# Patient Record
Sex: Male | Born: 1950 | Hispanic: No | Marital: Single
Health system: Southern US, Community
[De-identification: ages and names within clinical notes are randomized; demographics above are authoritative.]

## PROBLEM LIST (undated history)

## (undated) DIAGNOSIS — E559 Vitamin D deficiency, unspecified: Secondary | ICD-10-CM

## (undated) DIAGNOSIS — K219 Gastro-esophageal reflux disease without esophagitis: Secondary | ICD-10-CM

## (undated) DIAGNOSIS — E079 Disorder of thyroid, unspecified: Secondary | ICD-10-CM

## (undated) DIAGNOSIS — I4719 Other supraventricular tachycardia: Secondary | ICD-10-CM

## (undated) DIAGNOSIS — J189 Pneumonia, unspecified organism: Secondary | ICD-10-CM

## (undated) DIAGNOSIS — I69351 Hemiplegia and hemiparesis following cerebral infarction affecting right dominant side: Secondary | ICD-10-CM

## (undated) DIAGNOSIS — Z9181 History of falling: Secondary | ICD-10-CM

## (undated) DIAGNOSIS — I10 Essential (primary) hypertension: Secondary | ICD-10-CM

## (undated) DIAGNOSIS — D509 Iron deficiency anemia, unspecified: Secondary | ICD-10-CM

## (undated) DIAGNOSIS — D649 Anemia, unspecified: Secondary | ICD-10-CM

## (undated) DIAGNOSIS — I69959 Hemiplegia and hemiparesis following unspecified cerebrovascular disease affecting unspecified side: Secondary | ICD-10-CM

## (undated) DIAGNOSIS — R262 Difficulty in walking, not elsewhere classified: Secondary | ICD-10-CM

## (undated) DIAGNOSIS — R4701 Aphasia: Secondary | ICD-10-CM

## (undated) DIAGNOSIS — I619 Nontraumatic intracerebral hemorrhage, unspecified: Secondary | ICD-10-CM

## (undated) DIAGNOSIS — R1319 Other dysphagia: Secondary | ICD-10-CM

## (undated) DIAGNOSIS — I471 Supraventricular tachycardia: Secondary | ICD-10-CM

## (undated) DIAGNOSIS — I639 Cerebral infarction, unspecified: Secondary | ICD-10-CM

## (undated) DIAGNOSIS — E785 Hyperlipidemia, unspecified: Secondary | ICD-10-CM

## (undated) HISTORY — DX: Cerebral infarction, unspecified: I63.9

## (undated) HISTORY — DX: Anemia, unspecified: D64.9

## (undated) HISTORY — DX: Essential (primary) hypertension: I10

## (undated) HISTORY — PX: PEG PLACEMENT: SHX5437

## (undated) HISTORY — DX: Nontraumatic intracerebral hemorrhage, unspecified: I61.9

## (undated) HISTORY — DX: Hemiplegia and hemiparesis following cerebral infarction affecting right dominant side: I69.351

## (undated) HISTORY — DX: Pneumonia, unspecified organism: J18.9

## (undated) HISTORY — DX: Disorder of thyroid, unspecified: E07.9

## (undated) HISTORY — DX: Gastro-esophageal reflux disease without esophagitis: K21.9

## (undated) HISTORY — DX: Iron deficiency anemia, unspecified: D50.9

## (undated) HISTORY — DX: Other dysphagia: R13.19

## (undated) HISTORY — DX: History of falling: Z91.81

## (undated) HISTORY — DX: Vitamin D deficiency, unspecified: E55.9

## (undated) HISTORY — DX: Hemiplegia and hemiparesis following unspecified cerebrovascular disease affecting unspecified side: I69.959

---

## 2007-11-10 ENCOUNTER — Inpatient Hospital Stay (HOSPITAL_COMMUNITY): Admission: EM | Admit: 2007-11-10 | Discharge: 2007-11-29 | Payer: Self-pay | Admitting: Emergency Medicine

## 2007-11-10 ENCOUNTER — Ambulatory Visit: Payer: Self-pay | Admitting: Cardiology

## 2007-11-14 ENCOUNTER — Encounter (INDEPENDENT_AMBULATORY_CARE_PROVIDER_SITE_OTHER): Payer: Self-pay | Admitting: Surgery

## 2007-11-20 ENCOUNTER — Ambulatory Visit: Payer: Self-pay | Admitting: Internal Medicine

## 2007-11-21 ENCOUNTER — Encounter: Payer: Self-pay | Admitting: Gastroenterology

## 2008-01-08 ENCOUNTER — Ambulatory Visit (HOSPITAL_COMMUNITY): Admission: RE | Admit: 2008-01-08 | Discharge: 2008-01-08 | Payer: Self-pay | Admitting: Internal Medicine

## 2008-02-12 ENCOUNTER — Ambulatory Visit (HOSPITAL_COMMUNITY): Admission: RE | Admit: 2008-02-12 | Discharge: 2008-02-12 | Payer: Self-pay | Admitting: Internal Medicine

## 2008-07-09 ENCOUNTER — Ambulatory Visit: Payer: Self-pay | Admitting: Gastroenterology

## 2008-07-09 DIAGNOSIS — R1310 Dysphagia, unspecified: Secondary | ICD-10-CM

## 2008-07-09 DIAGNOSIS — R1319 Other dysphagia: Secondary | ICD-10-CM

## 2008-07-09 HISTORY — DX: Other dysphagia: R13.19

## 2009-02-05 IMAGING — CR DG CHEST 1V
1 series · 1 of 1 positions shown · non-contrast
Comparison: 11/17/2007

CLINICAL DATA: CHEST - 1 VIEW

56-year-old male intracranial hemorrhage, weakness

[view not recorded]
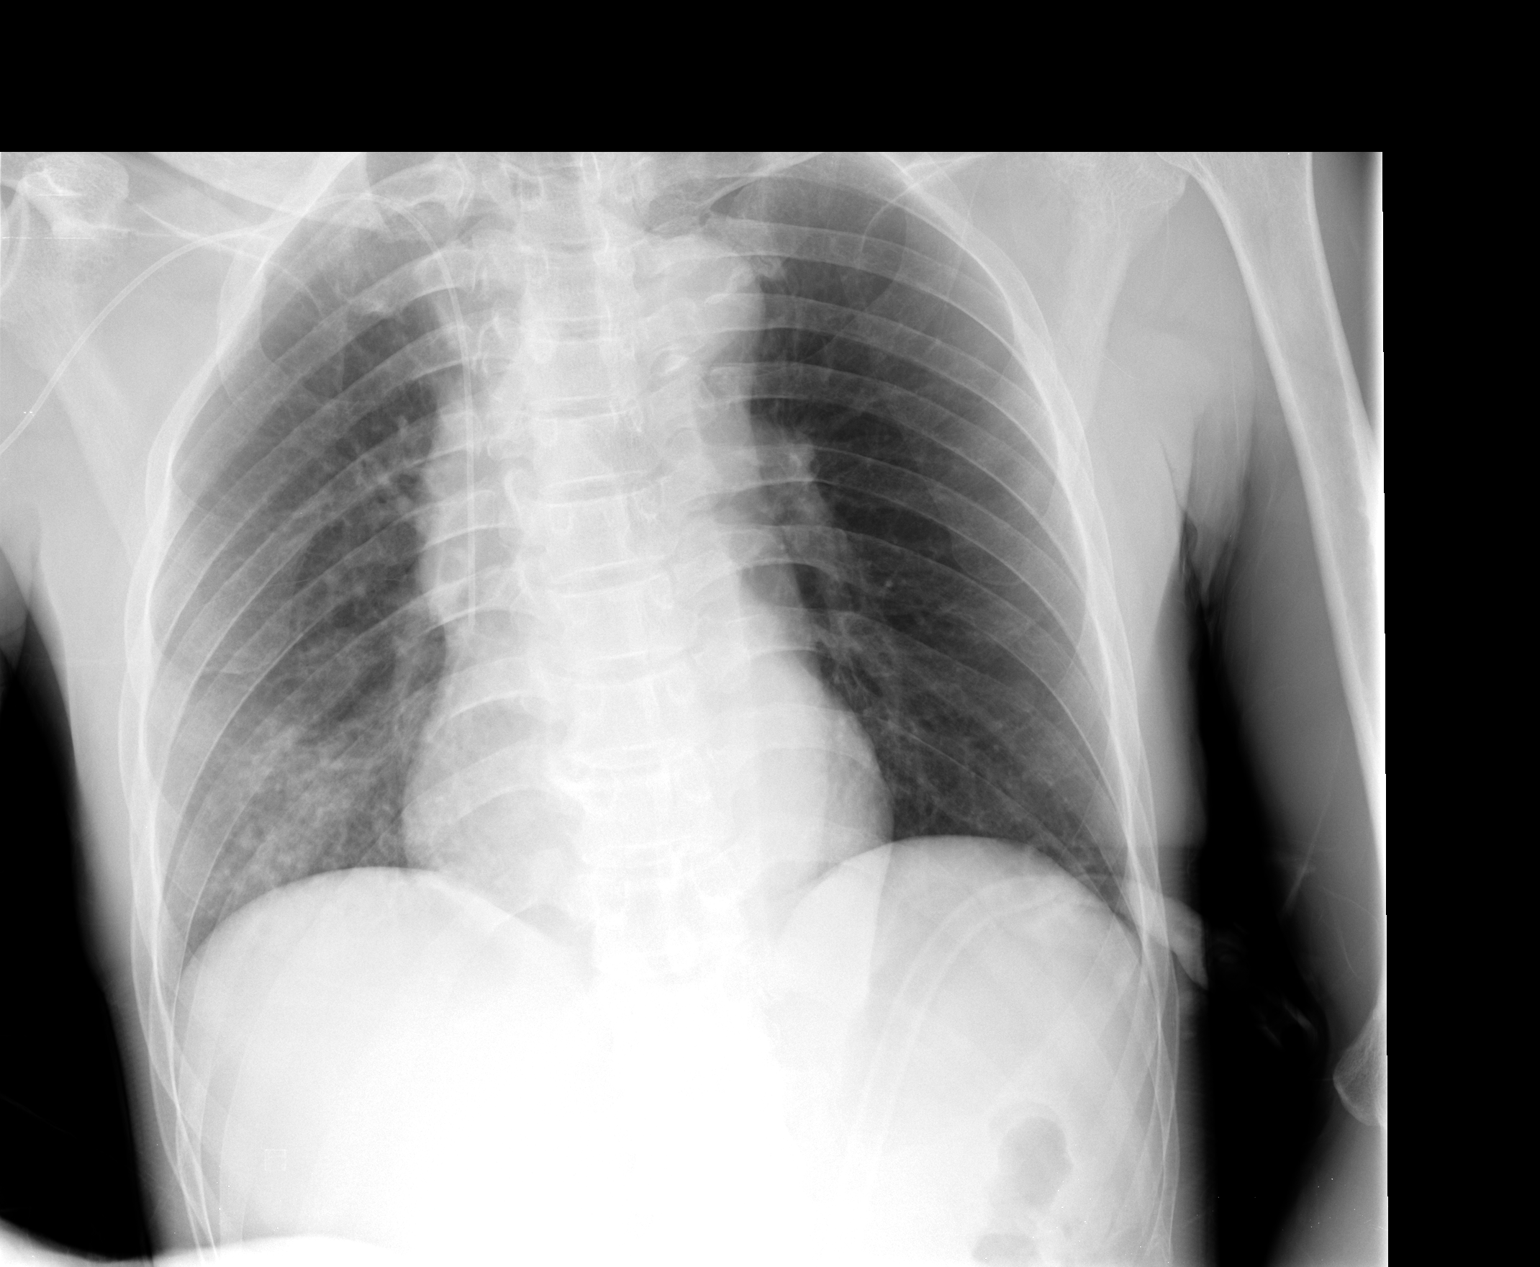

[1 of 1 positions shown; findings below may reference images not displayed]

FINDINGS: Feeding tube has been removed.  Right PICC line tip
remains in the SVC.  Normal heart size.  Right lower lobe patchy
airspace opacity is noted concerning for residual area of pneumonia
or aspiration.  Left lower lobe aeration has improved.  No large
effusion or pneumothorax.  Trachea is midline.  Left lower chest
nipple shadow is identified.
IMPRESSION: Persistent right lower lobe airspace opacity concerning for
pneumonia or aspiration.

## 2010-07-12 ENCOUNTER — Encounter (HOSPITAL_BASED_OUTPATIENT_CLINIC_OR_DEPARTMENT_OTHER): Payer: Self-pay | Admitting: Internal Medicine

## 2010-11-02 NOTE — Consult Note (Signed)
NAMEJOANNA, HALL                   ACCOUNT NO.:  [id number]   MEDICAL RECORD NO.:  [id number]          PATIENT TYPE:  INP   LOCATION:  3102                         FACILITY:  [facility]   PHYSICIAN:  Gustavus Messing. Orlin Hilding, M.D.DATE OF BIRTH:  [birth year]   DATE OF CONSULTATION:  DATE OF DISCHARGE:                                 CONSULTATION   CHIEF COMPLAINT:  Coma.   HISTORY PRESENT ILLNESS:  Filemon Breton is a 60 year old presumably  [place]  male, who has a history of hypertension and is known to be  noncompliant with medications.  He presumably went to bed in his normal  state of health last night.  No family is present for history and the  patient is comatose.  Presumably, he just did not wake up this morning.  The family noted this and called 911.  A CT on admission showed a large  hemorrhage in the left thalamo-basal ganglia region with  intraventricular extension.  The family has now left to take a shower  and I have no history available to me.   REVIEW OF SYSTEMS:  Not obtainable as the patient is in a coma.   PAST MEDICAL HISTORY:  Known only for hypertension.   MEDICATIONS:  None.  He is supposed to be on hydrochlorothiazide but  does not take it.   ALLERGIES:  No known drug allergies.   SOCIAL HISTORY:  I do not know if he is married.  I do not know if he is  employed.  I do not know about substance use.   FAMILY HISTORY:  Likewise unknown.   OBJECTIVE:  VITAL SIGNS:  Temperature is 97.3, blood pressure is 194/96,  heart rate 67, respirations 18, and 100% sat.  HEENT:  Head is normocephalic and atraumatic.  NECK:  Supple without bruits.  LUNGS:  Clear to auscultation.  HEART:  Regular rate and rhythm.  ABDOMEN:  Flat and nontender.  SKIN:  Dry.  Mucosa moist.  Nutritional status looks adequate.  NEUROLOGIC:  He is stuporous.  He does not answer any questions or obey  any commands.  He does have a full gaze.  There is no blink to threat on  either side.  There is no  obvious facial asymmetry.  He has at least  antigravity movement on the left.  He held the arm up to a count of 8  and then it dropped to the bed before 10.  However, he was not really  able to understand or follow commands.  He is certainly moving it  purposefully, but based on the grading scale, he scores a 2.  On the  right side, he has a spastic flexion, but no effort against gravity.  He  cannot hold the arm up, but he can move it a little bit.  Left lower  extremity, he at least move the legs, so he scores a level 3 but is not  able to hold it up or does not understand that I want him to hold it up.  On the right, there is no movement; it  also is spastic.  I cannot detect  if there is ataxia or not.  He certainly does not have any when he  reaches up to scratch his nose with his left hand.  Sensory, he  withdraws more on the left than the right but whether that is motor or  sensory I really cannot say.  He is mute.  There is no language  production.  I cannot really assess as to whether he has a neglect.  His  total score on the NIH Stroke Scale is therefore 26.   CT, as already described, shows a large basal ganglion thalamic  hemorrhage with intraventricular extension, probably some subarachnoid  blood as well.  There is diffuse mass effect.  No labs are yet  available.   ASSESSMENT:  Large severe left intracranial hemorrhage with  intraventricular extension.  Time of onset is unknown.  Last seen normal  when he went to bed last night.  It has been present for greater than 6  hours, it is likely hypertensive in nature.  No family is present.  This  may not be survivable.  He is at high risk for hydrocephalus and  herniation.  He is obviously not a tPA candidate.  He is not an  ACCELERATE candidate secondary to being out of the time frame.   PLAN:  We will admit him to the ICU.  We will need to repeat CT in about  6 hours or if there is any abrupt change in his condition.  He is  at  high risk for herniation and hydrocephalus.  He may need a  ventriculostomy if the family wishes to do everything that they can.      Catherine A. Orlin Hilding, M.D.  Electronically Signed     CAW/MEDQ  D:  11/10/2007  T:  11/10/2007  Job:  [id number]

## 2010-11-02 NOTE — Discharge Summary (Signed)
Philip [name], Philip [name]                  ACCOUNT NO.:  [id number]   MEDICAL RECORD NO.:  [id number]          PATIENT TYPE:  INP   LOCATION:  3040                         FACILITY:  [facility]   PHYSICIAN:  Pramod P. Pearlean Brownie, MD    DATE OF BIRTH:  August 12, 1950   DATE OF ADMISSION:  11/10/2007  DATE OF DISCHARGE:  11/29/2007                               DISCHARGE SUMMARY   DISCHARGE DIAGNOSES:  1. Right basal ganglia intracerebral hemorrhage with bilateral      intraventricular hemorrhage extension.  Hemorrhage secondary to      hypertension with noncompliance of medications.  2. Streptococcal urinary tract infection with full treatment      antibiotic course.  3. Aspiration pneumonia with antibiotics currently ongoing.  4. Oral thrush resolved.  5. Hypertension.   MEDICINES AT TIME OF DISCHARGE:  1. Hydrochlorothiazide 25 mg a day.  2. Labetalol 100 mg t.i.d.  3. Altace 5 mg a day.  4. K-Dur 40 mEq a day.  5. Free water 150 ml five times a day.  6. Jevity 237 ml five times a day.  7. Augment 875 mg b.i.d. times 7 days then discontinue.   STUDIES PERFORMED:  CT of the brain on admission shows a large acute  intraparenchymal hemorrhage in the center of the left basal ganglia with  evidence of mass effect within 5 mm left-to-right shift and mild  hydrocephalus.  Mild effacement of left quadrigeminal plate cistern and  suggestion of early cerebellar tonsillar herniation.  Acute hemorrhage  extension into lateral ventricle and fourth ventricle.  Follow-up CT at  12 hours shows unchanged hemorrhage.  Follow-up CT at 24 hours shows  stable left basal ganglia hematoma with intraventricular extension  consistent with hemorrhagic stroke.  Stable mass effect and midline  shift to the right approximating 5 mm.  No transtentorial or uncal  herniation.  No hydrocephalus.  Stable diffuse cerebral edema.  No new  intracranial abnormality.  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  Follow-up CT  at 3 days shows minimal decrease in the left  basal ganglia hemorrhage with minimal decrease in mass effect, stable  intraventricular hemorrhage and last CT performed June 1 showing  interval expansion of that area of hemorrhage in the left basal ganglia,  effacement of left lateral ventricle with progression of midline shift  now measuring 7 mm.  Last chest x-ray performed June 8 shows persistent  right lower lobe airspace opacity concerning for pneumonia or  aspiration.  Left abdominal chest x-ray performed May 28 showing PANDA  tube cold in stomach with gaseous prominence of large bowel.  No dilated  small bowel and right staghorn calculus.  Carotid Doppler shows no ICA  stenosis.  Vertebral flow antegrade bilaterally.  A 2-D echo shows EF of  55-60% with no left ventricular regional wall motion abnormality.  No  obvious embolic source.  No PFO.  EKG shows atrial fibrillation with  left ventricular hypertrophy.  PEG tube placement by Dr. Canary Brim on  November 21, 2007, without incident.   LABORATORY STUDIES:  Sodium 134, glucose 113,  otherwise chemistry  normal.  CBC white blood cells slightly elevated 11.3, hemoglobin 10.0,  hematocrit 31.3, MCV 63.2, RDW 16.4 and platelets 509.  Blood culture no  growth x2.  Urine culture showed greater than 100,000 colonies.  Streptococcus hemoglobin A1c unable to be determined due to abnormal  hemoglobin.  Homocystine is 7.2.  Cholesterol 108, triglycerides 49, HDL  43, LDL 55.  Urine drug screen negative.   HISTORY OF PRESENT ILLNESS:  Philip [name] is a 60 year old [place]  male with a history of hypertension who was noncompliant with his  medications.  He recently went to bed in his normal state of health the  night prior.  Per family, he did not wake up in the morning and family  called 9-1-1.  CT on admission showed a large hemorrhage in the left  basal ganglia region with intraventricular extension.  His blood  pressure was 194/96.  He  was placed on Cardene drip and admitted to the  neurointensive care unit for further management.   HOSPITAL COURSE:  Hemorrhage remained stable during hospitalization.  His blood pressure was variable and it took several additional  medications to get it under control, but he was eventually able to wean  from the Cardene and be transferred to the floor.  He initially had some  signs and symptoms of aspiration pneumonia and antibiotics were started.  Cultures grew out streptococcal urinary tract infection and antibiotics  were changed to cover that.  After a 7-day treatment course.  The  patient still had some residual aspiration pneumonia and Zosyn was  resumed.  He will be discharged on Augmentin for seven additional days  to completely resolve this.  Of note, EKG on admission showed atrial  fibrillation.  However, ongoing telemetry monitoring did not reveal  this.  Question if it was artifact on initial EKG.  We had multiple  discussions with family related to plan of care and prognosis.  We feel  as neuro prognosis is poor, needing skilled nursing facility care for  the rest of his life.  Family have discussed at length and in great  detail and have determined that they do want to pursue PEG placement and  nursing home.  GI was asked to place PEG, and it was placed without  difficulty.  The patient was transferred to the floor and skilled  nursing facility bed search put underway.  Once bed found, the patient  is stable for transfer.   CONDITION ON DISCHARGE:  Patient awake, alert.  He will follow  occasional simple commands though his English is very limited.  He has a  dense right hemiparesis and he blinks to threat bilaterally.   DISCHARGE/PLAN:  1. Discharge to skilled nursing facility for continuation of PT/OT and      speech therapy.  2. Continue Augmentin for seven more days to fully cover aspiration      pneumonia, then discontinue.  3. Ongoing aggressive stroke risk  factor management including blood      pressure control.  4. Follow-up with primary care physician at skilled nursing facility.  5. Followup with Dr. Delia Heady in 2-3 months.      Annie Main, N.P.    ______________________________  Sunny Schlein. Pearlean Brownie, MD    SB/MEDQ  D:  11/29/2007  T:  11/29/2007  Job:  [id number]   cc:   Pramod P. Pearlean Brownie, MD

## 2011-03-16 LAB — DIFFERENTIAL
Band Neutrophils: 0
Basophils Absolute: 0
Basophils Absolute: 0
Basophils Relative: 0
Eosinophils Absolute: 0
Eosinophils Absolute: 0.1
Eosinophils Relative: 0
Eosinophils Relative: 1
Lymphocytes Relative: 13
Lymphocytes Relative: 2 — ABNORMAL LOW
Lymphs Abs: 0.3 — ABNORMAL LOW
Metamyelocytes Relative: 0
Monocytes Absolute: 0.5
Monocytes Relative: 3
Neutrophils Relative %: 73

## 2011-03-16 LAB — BASIC METABOLIC PANEL
BUN: 16
BUN: 16
BUN: 20
BUN: 35 — ABNORMAL HIGH
CO2: 25
CO2: 27
Calcium: 8.4
Calcium: 8.5
Calcium: 8.6
Calcium: 8.7
Chloride: 101
Chloride: 103
Chloride: 111
Chloride: 116 — ABNORMAL HIGH
Creatinine, Ser: 0.86
Creatinine, Ser: 1.08
Creatinine, Ser: 1.18
GFR calc Af Amer: >60
GFR calc Af Amer: >60
GFR calc Af Amer: >60
GFR calc Af Amer: >60
GFR calc non Af Amer: 51 — ABNORMAL LOW
GFR calc non Af Amer: >60
GFR calc non Af Amer: >60
GFR calc non Af Amer: >60
GFR calc non Af Amer: >60
Glucose, Bld: 115 — ABNORMAL HIGH
Glucose, Bld: 121 — ABNORMAL HIGH
Glucose, Bld: 149 — ABNORMAL HIGH
Glucose, Bld: 160 — ABNORMAL HIGH
Glucose, Bld: 165 — ABNORMAL HIGH
Potassium: 3 — ABNORMAL LOW
Potassium: 3.5
Potassium: 3.5
Potassium: 4.9
Sodium: 134 — ABNORMAL LOW
Sodium: 136
Sodium: 146 — ABNORMAL HIGH
Sodium: 151 — ABNORMAL HIGH
Sodium: 151 — ABNORMAL HIGH

## 2011-03-16 LAB — COMPREHENSIVE METABOLIC PANEL
AST: 33
Albumin: 4
Alkaline Phosphatase: 75
BUN: 15
Chloride: 104
GFR calc Af Amer: >60
Potassium: 3.1 — ABNORMAL LOW
Sodium: 140
Total Protein: 7.2

## 2011-03-16 LAB — RAPID URINE DRUG SCREEN, HOSP PERFORMED
Barbiturates: NOT DETECTED
Benzodiazepines: NOT DETECTED
Cocaine: NOT DETECTED
Opiates: NOT DETECTED

## 2011-03-16 LAB — URINALYSIS, ROUTINE W REFLEX MICROSCOPIC
Bilirubin Urine: NEGATIVE
Glucose, UA: NEGATIVE
Ketones, ur: NEGATIVE
Leukocytes, UA: NEGATIVE
Protein, ur: 30 — AB
Specific Gravity, Urine: 1.011
Urobilinogen, UA: 1
Urobilinogen, UA: 1
pH: 7.5

## 2011-03-16 LAB — CBC
HCT: 32.6 — ABNORMAL LOW
HCT: 33 — ABNORMAL LOW
HCT: 34 — ABNORMAL LOW
HCT: 34.6 — ABNORMAL LOW
HCT: 35.6 — ABNORMAL LOW
Hemoglobin: 10.8 — ABNORMAL LOW
Hemoglobin: 11.3 — ABNORMAL LOW
MCHC: 31.3
MCV: 62.7 — ABNORMAL LOW
Platelets: 165
Platelets: 185
Platelets: 220
Platelets: 239
RBC: 4.61
RBC: 5.41
RDW: 15.9 — ABNORMAL HIGH
RDW: 16 — ABNORMAL HIGH
RDW: 16.3 — ABNORMAL HIGH
RDW: 16.4 — ABNORMAL HIGH
RDW: 16.5 — ABNORMAL HIGH
WBC: 13.4 — ABNORMAL HIGH
WBC: 16.8 — ABNORMAL HIGH
WBC: 17.2 — ABNORMAL HIGH
WBC: 19.9 — ABNORMAL HIGH
WBC: 21.7 — ABNORMAL HIGH

## 2011-03-16 LAB — PROTIME-INR: INR: 1

## 2011-03-16 LAB — CULTURE, BLOOD (ROUTINE X 2): Culture: NO GROWTH

## 2011-03-16 LAB — LIPID PANEL
Cholesterol: 108
HDL: 43
LDL Cholesterol: 55
Total CHOL/HDL Ratio: 2.5
Triglycerides: 49
VLDL: 10

## 2011-03-16 LAB — URINE MICROSCOPIC-ADD ON

## 2011-03-16 LAB — APTT: aPTT: 23 — ABNORMAL LOW

## 2011-03-16 LAB — URINE CULTURE: Colony Count: >=100000

## 2011-03-16 LAB — CARDIAC PANEL(CRET KIN+CKTOT+MB+TROPI)
Relative Index: 1.9
Total CK: 422 — ABNORMAL HIGH
Troponin I: 0.03

## 2011-03-17 LAB — CBC
HCT: 28.4 — ABNORMAL LOW
HCT: 28.6 — ABNORMAL LOW
HCT: 29.2 — ABNORMAL LOW
HCT: 31.3 — ABNORMAL LOW
Hemoglobin: 10 — ABNORMAL LOW
Hemoglobin: 8.8 — ABNORMAL LOW
MCHC: 31.2
MCV: 63.2 — ABNORMAL LOW
MCV: 64.8 — ABNORMAL LOW
Platelets: 349
Platelets: 422 — ABNORMAL HIGH
RBC: 4.38
RBC: 4.95
RDW: 16.4 — ABNORMAL HIGH
WBC: 11.3 — ABNORMAL HIGH
WBC: 11.6 — ABNORMAL HIGH

## 2011-03-17 LAB — BASIC METABOLIC PANEL
BUN: 22
BUN: 22
CO2: 26
Calcium: 8.8
Chloride: 103
Chloride: 99
Creatinine, Ser: 0.8
GFR calc Af Amer: >60
GFR calc Af Amer: >60
GFR calc non Af Amer: >60
GFR calc non Af Amer: >60
Glucose, Bld: 105 — ABNORMAL HIGH
Potassium: 3.8
Potassium: 3.9
Potassium: 3.9
Potassium: 4.3
Sodium: 134 — ABNORMAL LOW
Sodium: 135

## 2011-03-17 LAB — DIFFERENTIAL
Basophils Relative: 0
Eosinophils Absolute: 0.3
Monocytes Absolute: 1.5 — ABNORMAL HIGH
Monocytes Relative: 11
Neutro Abs: 10.8 — ABNORMAL HIGH

## 2011-07-28 ENCOUNTER — Ambulatory Visit (HOSPITAL_COMMUNITY): Payer: Self-pay

## 2011-08-01 ENCOUNTER — Other Ambulatory Visit (HOSPITAL_COMMUNITY): Payer: Self-pay | Admitting: Internal Medicine

## 2011-08-03 ENCOUNTER — Ambulatory Visit (HOSPITAL_COMMUNITY)
Admission: RE | Admit: 2011-08-03 | Discharge: 2011-08-03 | Disposition: A | Discharge status: Home / Self Care | Payer: Medicare Other | Source: Ambulatory Visit | Attending: Internal Medicine | Admitting: Internal Medicine

## 2011-08-03 DIAGNOSIS — R1313 Dysphagia, pharyngeal phase: Secondary | ICD-10-CM | POA: Insufficient documentation

## 2011-08-03 DIAGNOSIS — I10 Essential (primary) hypertension: Secondary | ICD-10-CM | POA: Insufficient documentation

## 2011-08-03 DIAGNOSIS — I69959 Hemiplegia and hemiparesis following unspecified cerebrovascular disease affecting unspecified side: Secondary | ICD-10-CM | POA: Insufficient documentation

## 2011-08-03 NOTE — Procedures (Signed)
Modified Barium Swallow Procedure Note Patient Details  Name: Philip [name] MRN: [id number] Date of Birth: [birth year]  Today's Date: 08/03/2011 Time:1000  - 1056  Recommendation/Prognosis  Clinical Impression Dysphagia Diagnosis: Mild oral phase dysphagia;Mild pharyngeal phase dysphagia Clinical impression: Pt presents with significant swallow improvement since previous MBS studies in 2009 after CVA.  No aspiration or penetration observed and pt cleared his throat throughout the entire evaluation.[place] before po given-but worse with swallowing po.  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  Pt continued to complain of pain throughout the evaluation, recommend follow with medical doctor regarding this issue, as MBS only evaluates swallow function.    Swallow Evaluation Recommendations Solid Consistency: Dysphagia 3 (Mechanical soft) Liquid Consistency: Thin Liquid Administration via: Cup;Straw Medication Administration: Other (Comment) (crush if large and not contraindicated..follow with water) Supervision: Patient able to self feed Compensations: Slow rate;Small sips/bites;Multiple dry swallows after each bite/sip (pt with reflexive dry swallows) Postural Changes and/or Swallow Maneuvers: Seated upright 90 degrees;Upright 30-60 min after meal Oral Care Recommendations: Oral care BID  Prognosis Prognosis for Safe Diet Advancement: Good Individuals Consulted Consulted and Agree with Results and Recommendations: Patient;Family member/caregiver Family Member Consulted: cousin Mcihael Hinderman present as interpreter Report Sent to : Primary SLP;Facility (Comment) [facility])  General:  HPI: 61 year old resident of SNF referred for outpt MBS due to concerns pt may be aspirating.  Pt presents with his cousin, who is interpreting through [employer], although communication d/o present from CVA.   PMH + for CVA with right  hemparesis, aphasia & dysphagia (2009), pain in join-lower leg, anemia, HTN, UTI, attention to gastronomy, personal h/o non compliance.  Pt complains of pain - pointing to pharynx- that is worse with food than drink.  Pt states his has been present x1 month, but with gradual onset.  Pt can eat a handful of food before his symptoms of choking set in.  Pt denies pnas and did not know if he lost weight.  Pt observed clearing his throat at baseline- cousin reports this occurs with meals more- but denies pt overtly coughing during meals.  Palpation of neck completed with neck tissue soft and no overt abnormalities but recommend pt follow up with his physician about his pain.  Pt does have a h/o of thrush in 2009, but oral cavity appeared clear and moist.  Pt medication list includes Prilosec.     Type of Study: Repeat MBS (2 MBS studies in 2009 after CVA) Diet Prior to this Study: Dysphagia 3 (soft);Thin liquids Respiratory Status: Room air Behavior/Cognition: Alert Oral Cavity - Dentition: Adequate natural dentition Oral Motor / Sensory Function: Impaired motor Oral impairment: Right facial;Right labial (apraxia, weak cough, effort to conduct dry swallow) Vision: Functional for self-feeding Patient Positioning: Upright in chair Baseline Vocal Quality: Clear Volitional Cough: Weak Volitional Swallow: Able to elicit (with delay) Anatomy: Within functional limits  Reason for Referral:  Concern for aspiration  Oral Phase Oral Preparation/Oral Phase Oral Phase: Impaired Oral - Nectar Oral - Nectar Cup: Piecemeal swallowing;Holding of bolus Oral - Thin Oral - Thin Teaspoon: Holding of bolus Oral - Thin Cup: Piecemeal swallowing;Holding of bolus Oral - Solids Oral - Puree: Piecemeal swallowing;Holding of bolus Oral - Regular: Piecemeal swallowing;Holding of bolus Oral - Pill: Piecemeal swallowing;Holding of bolus Oral Phase - Comment Oral Phase - Comment: piecemealing suspect compensatory for  this pt and functional, barium tablet did not transit into pharynx with first bolus of pudding-lodged at tongue  base-lodged at pyriform sinus with second bolus- clearing into esophagus with 3rd pudding bolus.  rec consider crushing pills if not contraindicated Pharyngeal Phase  Pharyngeal Phase Pharyngeal Phase: Impaired Pharyngeal - Nectar Pharyngeal - Nectar Cup: Pharyngeal residue - pyriform Pharyngeal - Thin Pharyngeal - Thin Teaspoon: Pharyngeal residue - pyriform Pharyngeal - Thin Cup: Pharyngeal residue - pyriform Pharyngeal - Solids Pharyngeal - Puree: Compensatory strategies attempted (Comment) (3 swallows to clear full bolus-pt state is normal) Pharyngeal - Regular: Premature spillage to valleculae Pharyngeal - Pill: Pharyngeal residue - pyriform;Premature spillage to valleculae;Compensatory strategies attempted (Comment) (further pudding boluses assisted to transit into esophagus) Pharyngeal Phase - Comment Pharyngeal Comment: trace vallecular residuals cleared with piecemeal swallows, ba tablet lodged at pyriform sinus - without pt sensation, clearing with further pudding Cervical Esophageal Phase  Cervical Esophageal Phase Cervical Esophageal Phase: Impaired Cervical Esophageal Phase - Solids Puree: Reduced cricopharyngeal relaxation Pill: Reduced cricopharyngeal relaxation Cervical Esophageal Phase - Comment Cervical Esophageal Comment: Pt appeared with backflow of pudding at UES x1 that cleared with reflexive dry swallow (from oral residuals spilling into pharynx with reflexive trigger), and stasis of tablet at UES without pt awareness, recommend caution with solid foods and medications-radiologist not present to confirm.  Distal esophagus appeared to clear well without delays.    Donavan Burnet, MS [facility] SLP [phone]

## 2011-08-30 ENCOUNTER — Other Ambulatory Visit (HOSPITAL_BASED_OUTPATIENT_CLINIC_OR_DEPARTMENT_OTHER): Payer: Self-pay | Admitting: Internal Medicine

## 2011-08-30 DIAGNOSIS — R070 Pain in throat: Secondary | ICD-10-CM

## 2011-09-02 ENCOUNTER — Ambulatory Visit (HOSPITAL_COMMUNITY)
Admission: RE | Admit: 2011-09-02 | Discharge: 2011-09-02 | Disposition: A | Discharge status: Home / Self Care | Payer: Medicaid Other | Source: Ambulatory Visit | Attending: Internal Medicine | Admitting: Internal Medicine

## 2011-09-02 DIAGNOSIS — E041 Nontoxic single thyroid nodule: Secondary | ICD-10-CM | POA: Insufficient documentation

## 2011-09-02 DIAGNOSIS — R070 Pain in throat: Secondary | ICD-10-CM

## 2012-09-07 ENCOUNTER — Non-Acute Institutional Stay (SKILLED_NURSING_FACILITY): Payer: Medicare Other | Admitting: Adult Health

## 2012-09-07 DIAGNOSIS — I10 Essential (primary) hypertension: Secondary | ICD-10-CM

## 2012-09-07 DIAGNOSIS — E559 Vitamin D deficiency, unspecified: Secondary | ICD-10-CM

## 2012-09-07 DIAGNOSIS — K219 Gastro-esophageal reflux disease without esophagitis: Secondary | ICD-10-CM

## 2012-09-07 DIAGNOSIS — I69959 Hemiplegia and hemiparesis following unspecified cerebrovascular disease affecting unspecified side: Secondary | ICD-10-CM

## 2012-10-19 ENCOUNTER — Non-Acute Institutional Stay (SKILLED_NURSING_FACILITY): Payer: Medicare Other | Admitting: Adult Health

## 2012-10-19 DIAGNOSIS — I69959 Hemiplegia and hemiparesis following unspecified cerebrovascular disease affecting unspecified side: Secondary | ICD-10-CM

## 2012-10-19 DIAGNOSIS — E559 Vitamin D deficiency, unspecified: Secondary | ICD-10-CM

## 2012-10-19 DIAGNOSIS — I10 Essential (primary) hypertension: Secondary | ICD-10-CM

## 2012-10-19 DIAGNOSIS — K219 Gastro-esophageal reflux disease without esophagitis: Secondary | ICD-10-CM

## 2012-12-07 ENCOUNTER — Non-Acute Institutional Stay (SKILLED_NURSING_FACILITY): Payer: Medicare Other | Admitting: Adult Health

## 2012-12-07 DIAGNOSIS — I10 Essential (primary) hypertension: Secondary | ICD-10-CM

## 2012-12-07 DIAGNOSIS — K219 Gastro-esophageal reflux disease without esophagitis: Secondary | ICD-10-CM

## 2012-12-07 DIAGNOSIS — E559 Vitamin D deficiency, unspecified: Secondary | ICD-10-CM

## 2012-12-07 DIAGNOSIS — I69959 Hemiplegia and hemiparesis following unspecified cerebrovascular disease affecting unspecified side: Secondary | ICD-10-CM

## 2013-01-21 ENCOUNTER — Non-Acute Institutional Stay (SKILLED_NURSING_FACILITY): Payer: Medicare Other | Admitting: Adult Health

## 2013-01-21 DIAGNOSIS — I10 Essential (primary) hypertension: Secondary | ICD-10-CM

## 2013-01-21 DIAGNOSIS — E559 Vitamin D deficiency, unspecified: Secondary | ICD-10-CM

## 2013-01-21 DIAGNOSIS — K219 Gastro-esophageal reflux disease without esophagitis: Secondary | ICD-10-CM

## 2013-01-21 DIAGNOSIS — I69959 Hemiplegia and hemiparesis following unspecified cerebrovascular disease affecting unspecified side: Secondary | ICD-10-CM

## 2013-03-05 ENCOUNTER — Encounter: Payer: Self-pay | Admitting: Adult Health

## 2013-03-05 DIAGNOSIS — E559 Vitamin D deficiency, unspecified: Secondary | ICD-10-CM | POA: Insufficient documentation

## 2013-03-05 DIAGNOSIS — K219 Gastro-esophageal reflux disease without esophagitis: Secondary | ICD-10-CM | POA: Insufficient documentation

## 2013-03-05 DIAGNOSIS — I10 Essential (primary) hypertension: Secondary | ICD-10-CM | POA: Insufficient documentation

## 2013-03-05 DIAGNOSIS — I69959 Hemiplegia and hemiparesis following unspecified cerebrovascular disease affecting unspecified side: Secondary | ICD-10-CM | POA: Insufficient documentation

## 2013-03-05 NOTE — Assessment & Plan Note (Signed)
Will continue vitamin d 2000 units daily

## 2013-03-05 NOTE — Assessment & Plan Note (Signed)
He remains without change in his status will continue his baclofen 5 mg three times daily for spasticity and will monitor his status

## 2013-03-05 NOTE — Progress Notes (Signed)
Patient ID: Philip [name], male   DOB: Dec 04, 1950, 62 y.o.   MRN: [id number]  [place]  No Known Allergies   Chief Complaint  Patient presents with  . Medical Managment of Chronic Issues    HPI: He is being seen for the medical management of his chronic illnesses. Overall he is doing well. There ar eno concerns being voiced by the nursing staff at this time. He is unable to participate in the hpi or ros.   Past Medical History  Diagnosis Date  . CVA (cerebral vascular accident)   . Hemiplegia affecting dominant side, late effect of cerebrovascular disease   . Vitamin D deficiency   . Essential hypertension, benign   . GERD (gastroesophageal reflux disease)   . Anemia   . Thyroid disease   . Pneumonia     No past surgical history on file.  VITAL SIGNS BP 122/76  Pulse 66  Ht 5\' 6"  (1.676 m)  Wt 114 lb (51.71 kg)  BMI 18.41 kg/m2   Patient's Medications  New Prescriptions   No medications on file  Previous Medications   BACLOFEN (LIORESAL) 10 MG TABLET    Take 5 mg by mouth 3 (three) times daily.   CHOLECALCIFEROL (VITAMIN D) 1000 UNITS TABLET    Take 2,000 Units by mouth daily.   LABETALOL (NORMODYNE) 100 MG TABLET    Take 100 mg by mouth daily.   RAMIPRIL (ALTACE) 5 MG CAPSULE    Take 5 mg by mouth daily.   RANITIDINE (ZANTAC) 150 MG TABLET    Take 150 mg by mouth daily.  Modified Medications   No medications on file  Discontinued Medications   No medications on file    SIGNIFICANT DIAGNOSTIC EXAMS    LABS REVIEWED:   10-12-11: glucose 98; bun 8; creat 0.98; k+ 4.3; na++ 142   Review of Systems  Unable to perform ROS   Physical Exam  Constitutional:  thin  Neck: Neck supple. No JVD present.  Cardiovascular: Normal rate, regular rhythm and intact distal pulses.   Respiratory: Effort normal and breath sounds normal. No respiratory distress. He has no wheezes.  GI: Soft. Bowel sounds are normal. He exhibits no distension. There is no tenderness.   Musculoskeletal: He exhibits no edema.  Has right side hemiparesis; is able to move left extremities.   Neurological: He is alert.  Skin: Skin is dry.       ASSESSMENT/ PLAN:  Essential hypertension, benign He is stable will continue altace 5 mg daily and labetalol 100 mg daily and will monitor his status   GERD (gastroesophageal reflux disease) Is stable will continue zantac 150 mg daily   Hemiplegia affecting dominant side, late effect of cerebrovascular disease He remains without change in his status will continue his baclofen 5 mg three times daily for spasticity and will monitor his status   Vitamin D deficiency Will continue vitamin d 2000 units daily     Will check cbc and cmp next draw

## 2013-03-05 NOTE — Assessment & Plan Note (Signed)
He is stable will continue altace 5 mg daily and labetalol 100 mg daily and will monitor his status

## 2013-03-05 NOTE — Assessment & Plan Note (Signed)
Is stable will continue zantac 150 mg daily

## 2013-03-07 NOTE — Progress Notes (Signed)
Patient ID: Philip [name], male   DOB: 03/04/1951, 62 y.o.   MRN: [id number]  [place]  No Known Allergies   Chief Complaint  Patient presents with  . Medical Managment of Chronic Issues    HPI: He is being seen for the medical management of his chronic illnesses. Overall he is doing well. There are no concerns being voiced by the nursing staff at this time. He is unable to participate in the hpi or ros.   Past Medical History  Diagnosis Date  . CVA (cerebral vascular accident)   . Hemiplegia affecting dominant side, late effect of cerebrovascular disease   . Vitamin D deficiency   . Essential hypertension, benign   . GERD (gastroesophageal reflux disease)   . Anemia   . Thyroid disease   . Pneumonia     No past surgical history on file.  Filed Vitals:   10/19/12 1301  BP: 113/82  Pulse: 74  Height: 5\' 6"  (1.676 m)  Weight: 118 lb (53.524 kg)       Patient's Medications  New Prescriptions   No medications on file  Previous Medications   BACLOFEN (LIORESAL) 10 MG TABLET    Take 5 mg by mouth 3 (three) times daily.   CHOLECALCIFEROL (VITAMIN D) 1000 UNITS TABLET    Take 2,000 Units by mouth daily.   LABETALOL (NORMODYNE) 100 MG TABLET    Take 100 mg by mouth daily.   RAMIPRIL (ALTACE) 5 MG CAPSULE    Take 5 mg by mouth daily.   RANITIDINE (ZANTAC) 150 MG TABLET    Take 150 mg by mouth daily.  Modified Medications   No medications on file  Discontinued Medications   No medications on file    SIGNIFICANT DIAGNOSTIC EXAMS    LABS REVIEWED:   10-12-11: glucose 98; bun 8; creat 0.98; k+ 4.3; na++ 142  09-10-12: wbc 6.4; hgb 10.8; hct 30.9; mcv 57.8; plt 208; glucose 80; bun 16; creat 1.01; k+4.6; na++139; liver normal albumin 3.9     Review of Systems  Unable to perform ROS   Physical Exam  Constitutional:  thin  Neck: Neck supple. No JVD present.  Cardiovascular: Normal rate, regular rhythm and intact distal pulses.   Respiratory: Effort normal and  breath sounds normal. No respiratory distress. He has no wheezes.  GI: Soft. Bowel sounds are normal. He exhibits no distension. There is no tenderness.  Musculoskeletal: He exhibits no edema.  Has right side hemiparesis; is able to move left extremities.   Neurological: He is alert.  Skin: Skin is dry.       ASSESSMENT/ PLAN:  Essential hypertension, benign He is stable will continue altace 5 mg daily and labetalol 100 mg daily and will monitor his status   GERD (gastroesophageal reflux disease) Is stable will continue zantac 150 mg daily   Hemiplegia affecting dominant side, late effect of cerebrovascular disease He remains without change in his status will continue his baclofen 5 mg three times daily for spasticity and will monitor his status   Vitamin D deficiency Will continue vitamin d 2000 units daily

## 2013-03-22 ENCOUNTER — Non-Acute Institutional Stay (SKILLED_NURSING_FACILITY): Payer: Medicare Other | Admitting: Adult Health

## 2013-03-22 DIAGNOSIS — K219 Gastro-esophageal reflux disease without esophagitis: Secondary | ICD-10-CM

## 2013-03-22 DIAGNOSIS — I69959 Hemiplegia and hemiparesis following unspecified cerebrovascular disease affecting unspecified side: Secondary | ICD-10-CM

## 2013-03-22 DIAGNOSIS — I10 Essential (primary) hypertension: Secondary | ICD-10-CM

## 2013-03-22 DIAGNOSIS — E559 Vitamin D deficiency, unspecified: Secondary | ICD-10-CM

## 2013-04-12 ENCOUNTER — Encounter: Payer: Self-pay | Admitting: Adult Health

## 2013-04-12 NOTE — Progress Notes (Signed)
Patient ID: Philip [name], male   DOB: January 25, 1951, 62 y.o.   MRN: [id number]  [place]  No Known Allergies   Chief Complaint  Patient presents with  . Medical Managment of Chronic Issues    HPI:  He is being seen for the management of his chronic illnesses. Overall his status remains unchanged. There are no concerns being voiced by the nursing staff at this time. He is unable to fully participate in hpi or ros.   Past Medical History  Diagnosis Date  . CVA (cerebral vascular accident)   . Hemiplegia affecting dominant side, late effect of cerebrovascular disease   . Vitamin D deficiency   . Essential hypertension, benign   . GERD (gastroesophageal reflux disease)   . Anemia   . Thyroid disease   . Pneumonia     No past surgical history on file.  VITAL SIGNS BP 100/80  Pulse 70  Ht 5\' 6"  (1.676 m)  Wt 121 lb (54.885 kg)  BMI 19.54 kg/m2   Patient's Medications  New Prescriptions   No medications on file  Previous Medications   ACETAMINOPHEN (TYLENOL) 500 MG TABLET    Take 1,000 mg by mouth 2 (two) times daily.   BACLOFEN (LIORESAL) 10 MG TABLET    Take 5 mg by mouth 3 (three) times daily.   CHOLECALCIFEROL (VITAMIN D) 1000 UNITS TABLET    Take 2,000 Units by mouth daily.   LABETALOL (NORMODYNE) 100 MG TABLET    Take 100 mg by mouth daily.   RAMIPRIL (ALTACE) 5 MG CAPSULE    Take 5 mg by mouth daily.   RANITIDINE (ZANTAC) 150 MG TABLET    Take 150 mg by mouth daily.  Modified Medications   No medications on file  Discontinued Medications   No medications on file    SIGNIFICANT DIAGNOSTIC EXAMS    LABS REVIEWED:    09-10-12: wbc 6.4; hgb 10.8; hct 30.9; mcv 57.8; plt 208; glucose 80; bun 16; creat 1.01; k+4.6; na++139; liver normal albumin 3.9     Review of Systems  Unable to perform ROS   Physical Exam  Constitutional:  thin  Neck: Neck supple. No JVD present.  Cardiovascular: Normal rate, regular rhythm and intact distal pulses.   Respiratory:  Effort normal and breath sounds normal. No respiratory distress. He has no wheezes.  GI: Soft. Bowel sounds are normal. He exhibits no distension. There is no tenderness.  Musculoskeletal: He exhibits no edema.  Has right side hemiparesis; is able to move left extremities.   Neurological: He is alert.  Skin: Skin is dry.       ASSESSMENT/ PLAN:  Essential hypertension, benign He is stable will continue altace 5 mg daily and labetalol 100 mg daily and will monitor his status   GERD (gastroesophageal reflux disease) Is stable will continue zantac 150 mg daily   Hemiplegia affecting dominant side, late effect of cerebrovascular disease He remains without change in his status will continue his baclofen 5 mg three times daily for spasticity and tylenol 1 gm twice daily for pain  will monitor his status   Vitamin D deficiency Will continue vitamin d 2000 units daily

## 2013-04-16 ENCOUNTER — Encounter: Payer: Self-pay | Admitting: Adult Health

## 2013-04-16 NOTE — Progress Notes (Signed)
Patient ID: Philip [name], male   DOB: [birth year], 62 y.o.   MRN: [id number]  [place]  No Known Allergies   Chief Complaint  Patient presents with  . Medical Managment of Chronic Issues    HPI:  He is being seen for the management of his chronic illnesses. Overall his status remains unchanged. He unable to participate in the hpi or ros. The nursing staff is not voicing any concerns at this time.   Past Medical History  Diagnosis Date  . CVA (cerebral vascular accident)   . Hemiplegia affecting dominant side, late effect of cerebrovascular disease   . Vitamin D deficiency   . Essential hypertension, benign   . GERD (gastroesophageal reflux disease)   . Anemia   . Thyroid disease   . Pneumonia     No past surgical history on file.  VITAL SIGNS BP 110/74  Pulse 76  Ht 5\' 6"  (1.676 m)  Wt 122 lb (55.339 kg)  BMI 19.7 kg/m2   Patient's Medications  New Prescriptions   No medications on file  Previous Medications   ACETAMINOPHEN (TYLENOL) 500 MG TABLET    Take 1,000 mg by mouth 2 (two) times daily.   BACLOFEN (LIORESAL) 10 MG TABLET    Take 5 mg by mouth 3 (three) times daily.   CHOLECALCIFEROL (VITAMIN D) 1000 UNITS TABLET    Take 2,000 Units by mouth daily.   LABETALOL (NORMODYNE) 100 MG TABLET    Take 100 mg by mouth daily.   RAMIPRIL (ALTACE) 5 MG CAPSULE    Take 5 mg by mouth daily.   RANITIDINE (ZANTAC) 150 MG TABLET    Take 150 mg by mouth daily.  Modified Medications   No medications on file  Discontinued Medications   No medications on file    SIGNIFICANT DIAGNOSTIC EXAMS   LABS REVIEWED:    09-10-12: wbc 6.4; hgb 10.8; hct 30.9; mcv 57.8; plt 208; glucose 80; bun 16; creat 1.01; k+4.6; na++139; liver normal albumin 3.9     Review of Systems  Unable to perform ROS   Physical Exam  Constitutional:  thin  Neck: Neck supple. No JVD present.  Cardiovascular: Normal rate, regular rhythm and intact distal pulses.   Respiratory: Effort normal and  breath sounds normal. No respiratory distress. He has no wheezes.  GI: Soft. Bowel sounds are normal. He exhibits no distension. There is no tenderness.  Musculoskeletal: He exhibits no edema.  Has right side hemiparesis; is able to move left extremities.   Neurological: He is alert.  Skin: Skin is dry.       ASSESSMENT/ PLAN:  Essential hypertension, benign He is stable will continue altace 5 mg daily and labetalol 100 mg daily and will monitor his status   GERD (gastroesophageal reflux disease) Is stable will continue zantac 150 mg daily   Hemiplegia affecting dominant side, late effect of cerebrovascular disease He remains without change in his status will continue his baclofen 5 mg three times daily for spasticity and tylenol 1 gm twice daily for pain  will monitor his status   Vitamin D deficiency Will continue vitamin d 2000 units daily    Will check cbc; cmp and psa next draw

## 2013-04-23 NOTE — Progress Notes (Signed)
Patient ID: Philip [name], male   DOB: Nov 14, 1950, 62 y.o.   MRN: [id number]  [place]  No Known Allergies  Chief Complaint  Patient presents with  . Medical Managment of Chronic Issues    HPI  He is being seen for the management of his chronic illnesses. Overall his status remains stable; there are no concerns being voiced by the nursing staff at this time. He is unable to fully participate in the hpi or ros.   Past Medical History  Diagnosis Date  . CVA (cerebral vascular accident)   . Hemiplegia affecting dominant side, late effect of cerebrovascular disease   . Vitamin D deficiency   . Essential hypertension, benign   . GERD (gastroesophageal reflux disease)   . Anemia   . Thyroid disease   . Pneumonia   . DYSPHAGIA 07/09/2008    Qualifier: Diagnosis of  By: Christella Hartigan MD, Melton Alar     No past surgical history on file.  Filed Vitals:   12/07/12 1128  BP: 122/73  Pulse: 68  Height: 5\' 6"  (1.676 m)  Weight: 119 lb (53.978 kg)      Patient's Medications  New Prescriptions   No medications on file  Previous Medications   BACLOFEN (LIORESAL) 10 MG TABLET    Take 5 mg by mouth 3 (three) times daily.   CHOLECALCIFEROL (VITAMIN D) 1000 UNITS TABLET    Take 2,000 Units by mouth daily.   LABETALOL (NORMODYNE) 100 MG TABLET    Take 100 mg by mouth daily.   RAMIPRIL (ALTACE) 5 MG CAPSULE    Take 5 mg by mouth daily.   RANITIDINE (ZANTAC) 150 MG TABLET    Take 150 mg by mouth daily.  Modified Medications   No medications on file  Discontinued Medications   No medications on file    SIGNIFICANT DIAGNOSTIC EXAMS    LABS REVIEWED:   10-12-11: glucose 98; bun 8; creat 0.98; k+ 4.3; na++ 142  09-10-12: wbc 6.4; hgb 10.8; hct 30.9; mcv 57.8; plt 208; glucose 80; bun 16; creat 1.01; k+4.6; na++139; liver normal albumin 3.9     Review of Systems  Unable to perform ROS   Physical Exam  Constitutional:  thin  Neck: Neck supple. No JVD present.  Cardiovascular: Normal  rate, regular rhythm and intact distal pulses.   Respiratory: Effort normal and breath sounds normal. No respiratory distress. He has no wheezes.  GI: Soft. Bowel sounds are normal. He exhibits no distension. There is no tenderness.  Musculoskeletal: He exhibits no edema.  Has right side hemiparesis; is able to move left extremities.   Neurological: He is alert.  Skin: Skin is dry.       ASSESSMENT/ PLAN:  Essential hypertension, benign He is stable will continue altace 5 mg daily and labetalol 100 mg daily and will monitor his status   GERD (gastroesophageal reflux disease) Is stable will continue zantac 150 mg daily   Hemiplegia affecting dominant side, late effect of cerebrovascular disease He remains without change in his status will continue his baclofen 5 mg three times daily for spasticity and will monitor his status   Vitamin D deficiency Will continue vitamin d 2000 units daily

## 2013-04-26 ENCOUNTER — Non-Acute Institutional Stay (SKILLED_NURSING_FACILITY): Payer: Medicare Other | Admitting: Adult Health

## 2013-04-26 DIAGNOSIS — R1319 Other dysphagia: Secondary | ICD-10-CM

## 2013-04-26 DIAGNOSIS — E559 Vitamin D deficiency, unspecified: Secondary | ICD-10-CM

## 2013-04-26 DIAGNOSIS — K219 Gastro-esophageal reflux disease without esophagitis: Secondary | ICD-10-CM

## 2013-04-26 DIAGNOSIS — I69959 Hemiplegia and hemiparesis following unspecified cerebrovascular disease affecting unspecified side: Secondary | ICD-10-CM

## 2013-04-26 DIAGNOSIS — I10 Essential (primary) hypertension: Secondary | ICD-10-CM

## 2013-04-29 ENCOUNTER — Encounter: Payer: Self-pay | Admitting: Adult Health

## 2013-04-29 NOTE — Progress Notes (Signed)
Patient ID: Philip [name], male   DOB: 08/17/1950, 62 y.o.   MRN: [id number]  [place]  No Known Allergies   Chief Complaint  Patient presents with  . Medical Managment of Chronic Issues    HPI: He is being seen for the management of his chronic illnesses. Overall his status remains without significant change over the recent past. There are no concerns being voiced by the nursing staff at this time he is unable to participate in the hpi or ros.   Past Medical History  Diagnosis Date  . CVA (cerebral vascular accident)   . Hemiplegia affecting dominant side, late effect of cerebrovascular disease   . Vitamin D deficiency   . Essential hypertension, benign   . GERD (gastroesophageal reflux disease)   . Anemia   . Thyroid disease   . Pneumonia   . DYSPHAGIA 07/09/2008    Qualifier: Diagnosis of  By: Christella Hartigan MD, Melton Alar     No past surgical history on file.  VITAL SIGNS BP 138/70  Pulse 78  Ht 5\' 6"  (1.676 m)  Wt 121 lb (54.885 kg)  BMI 19.54 kg/m2   Patient's Medications  New Prescriptions   No medications on file  Previous Medications   ACETAMINOPHEN (TYLENOL) 500 MG TABLET    Take 1,000 mg by mouth 2 (two) times daily.   BACLOFEN (LIORESAL) 10 MG TABLET    Take 5 mg by mouth 3 (three) times daily.   CHOLECALCIFEROL (VITAMIN D) 1000 UNITS TABLET    Take 2,000 Units by mouth daily.   LABETALOL (NORMODYNE) 100 MG TABLET    Take 100 mg by mouth daily.   RAMIPRIL (ALTACE) 5 MG CAPSULE    Take 5 mg by mouth daily.   RANITIDINE (ZANTAC) 150 MG TABLET    Take 150 mg by mouth daily.  Modified Medications   No medications on file  Discontinued Medications   No medications on file    SIGNIFICANT DIAGNOSTIC EXAMS   LABS REVIEWED:    09-10-12: wbc 6.4; hgb 10.8; hct 30.9; mcv 57.8; plt 208; glucose 80; bun 16; creat 1.01; k+4.6; na++139; liver normal albumin 3.9  03-25-13: glucose 132; bun 14; creat 1.03; k+3.8; na++ 137; liver normal albumin 4.4 PSA 0.89 04-26-13: wbc 5.8;  hgb 10.6; hct 31.5 ;mcv 89.2 ;plt 220     Review of Systems  Unable to perform ROS   Physical Exam  Constitutional:  thin  Neck: Neck supple. No JVD present.  Cardiovascular: Normal rate, regular rhythm and intact distal pulses.   Respiratory: Effort normal and breath sounds normal. No respiratory distress. He has no wheezes.  GI: Soft. Bowel sounds are normal. He exhibits no distension. There is no tenderness.  Musculoskeletal: He exhibits no edema.  Has right side hemiparesis; is able to move left extremities.   Neurological: He is alert.  Skin: Skin is dry.       ASSESSMENT/ PLAN:  Essential hypertension, benign He is stable will continue altace 5 mg daily and labetalol 100 mg daily and will monitor his status   GERD (gastroesophageal reflux disease) Is stable will continue zantac 150 mg daily   Hemiplegia affecting dominant side, late effect of cerebrovascular disease He remains without change in his status will continue his baclofen 5 mg three times daily for spasticity and tylenol 1 gm twice daily for pain  will monitor his status   Vitamin D deficiency Will continue vitamin d 2000 units daily

## 2013-05-24 ENCOUNTER — Non-Acute Institutional Stay (SKILLED_NURSING_FACILITY): Payer: Medicare Other | Admitting: Adult Health

## 2013-05-24 DIAGNOSIS — I69959 Hemiplegia and hemiparesis following unspecified cerebrovascular disease affecting unspecified side: Secondary | ICD-10-CM

## 2013-05-24 DIAGNOSIS — K219 Gastro-esophageal reflux disease without esophagitis: Secondary | ICD-10-CM

## 2013-05-24 DIAGNOSIS — I10 Essential (primary) hypertension: Secondary | ICD-10-CM

## 2013-05-24 DIAGNOSIS — E559 Vitamin D deficiency, unspecified: Secondary | ICD-10-CM

## 2013-05-24 DIAGNOSIS — R1319 Other dysphagia: Secondary | ICD-10-CM

## 2013-05-27 MED ORDER — OMEPRAZOLE 20 MG PO CPDR: 20.0000 mg | DELAYED_RELEASE_CAPSULE | Freq: Every day | ORAL | Status: DC

## 2013-05-27 NOTE — Progress Notes (Signed)
Patient ID: Philip [name], male   DOB: 08/20/1950, 62 y.o.   MRN: [id number]     [place]  No Known Allergies   Chief Complaint  Patient presents with  . Medical Managment of Chronic Issues    HPI: He is being seen for the management of his chronic illnesses. He cannot fully participate in the hpi or ros due his language barrier. He is being seen by speech therapy. He is complaining of throat pain he says the pain happens when he swallows; but that he has some pain all the time. From the best I can tell there is no burning present.   Past Medical History  Diagnosis Date  . CVA (cerebral vascular accident)   . Hemiplegia affecting dominant side, late effect of cerebrovascular disease   . Vitamin D deficiency   . Essential hypertension, benign   . GERD (gastroesophageal reflux disease)   . Anemia   . Thyroid disease   . Pneumonia   . DYSPHAGIA 07/09/2008    Qualifier: Diagnosis of  By: Christella Hartigan MD, Melton Alar     No past surgical history on file.  VITAL SIGNS BP 122/72  Pulse 78  Ht 5\' 6"  (1.676 m)  Wt 121 lb (54.885 kg)  BMI 19.54 kg/m2   Patient's Medications  New Prescriptions   No medications on file  Previous Medications   ACETAMINOPHEN (TYLENOL) 500 MG TABLET    Take 1,000 mg by mouth 2 (two) times daily.   BACLOFEN (LIORESAL) 10 MG TABLET    Take 5 mg by mouth 3 (three) times daily.   CHOLECALCIFEROL (VITAMIN D) 1000 UNITS TABLET    Take 2,000 Units by mouth daily.   LABETALOL (NORMODYNE) 100 MG TABLET    Take 100 mg by mouth daily.   RAMIPRIL (ALTACE) 5 MG CAPSULE    Take 5 mg by mouth daily.   RANITIDINE (ZANTAC) 150 MG TABLET    Take 150 mg by mouth daily.  Modified Medications   No medications on file  Discontinued Medications   No medications on file    SIGNIFICANT DIAGNOSTIC EXAMS  09-02-11: thyroid ultrasound: normal sized thyroid no dominant mass or nodule present.   05-21-13: FEES: regular diet; thin liquids   LABS REVIEWED:    08-08-11: tsh 0.619;  FTI 3.5; thyroglobulin 3.0; thyroid antibodies <20.0; tpo <10.0; free t3 2.9;   free t4: 1.26   09-10-12: wbc 6.4; hgb 10.8; hct 30.9; mcv 57.8; plt 208; glucose 80; bun 16; creat 1.01; k+4.6; na++139; liver normal albumin 3.9  03-25-13: glucose 132; bun 14; creat 1.03; k+3.8; na++ 137; liver normal albumin 4.4 PSA 0.89 04-26-13: wbc 5.8; hgb 10.6; hct 31.5 ;mcv 89.2 ;plt 220     Review of Systems  Unable to perform ROS   Physical Exam  Constitutional:  thin  Neck: Neck supple. No JVD present.  Cardiovascular: Normal rate, regular rhythm and intact distal pulses.   Respiratory: Effort normal and breath sounds normal. No respiratory distress. He has no wheezes.  GI: Soft. Bowel sounds are normal. He exhibits no distension. There is no tenderness.  Musculoskeletal: He exhibits no edema.  Has right side hemiparesis; is able to move left extremities.   Neurological: He is alert.  Skin: Skin is dry.       ASSESSMENT/ PLAN:  Essential hypertension, benign He is stable will continue altace 5 mg daily and labetalol 100 mg daily and will monitor his status   GERD (gastroesophageal reflux disease) Is is worse; will stop  the zantac and will begin prilosec 20 mg daily and will have him seen by GI for gerd with pain present and will continue to monitor his status.   Hemiplegia affecting dominant side, late effect of cerebrovascular disease He remains without change in his status will continue his baclofen 5 mg three times daily for spasticity and tylenol 1 gm twice daily for pain  will monitor his status   Vitamin D deficiency Will continue vitamin d 2000 units daily

## 2013-06-04 ENCOUNTER — Non-Acute Institutional Stay (SKILLED_NURSING_FACILITY): Payer: Medicare Other | Admitting: Internal Medicine

## 2013-06-04 DIAGNOSIS — I69959 Hemiplegia and hemiparesis following unspecified cerebrovascular disease affecting unspecified side: Secondary | ICD-10-CM

## 2013-06-04 DIAGNOSIS — I10 Essential (primary) hypertension: Secondary | ICD-10-CM

## 2013-06-04 NOTE — Progress Notes (Signed)
Patient ID: Philip [name], male   DOB: 01-10-1951, 62 y.o.   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 See results of CT scan below. He apparently has had increasing difficulty with right lower extremity tone which makes it difficult for him to attempt to participate in transfers to. He currently takes baclofen 5 mg 3 times a day. He already has an AFO brace  Also the patient's recent complaints seem to involve throat pain although through his language impairment it is really difficult to chart to rise this. He has a history of gastroesophageal reflux and he is already been referred to GI. His Zantac was changed to Prilosec all of this seems reasonable  Physical examination Cardiac; heart sounds are normal there is no murmurs or gallops Neck.there is no palpable mass, lymph nodes.  Oral exam unremarkable, what i can see of the oral pharynx.  Neuro. his right upper extremity is completely without active moment movement . There is increased tone has arisen the lower extremity   Impression/plan #1 late effect dominant hemisphere CVA with increased tone. 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 It is reasonable to put him on a PPI at this point. GI is also a reasonable first start to work this up. I don't think I don't feel anything abnormal in his neck.

## 2013-07-01 ENCOUNTER — Non-Acute Institutional Stay (SKILLED_NURSING_FACILITY): Payer: Medicare Other | Admitting: Nurse Practitioner

## 2013-07-01 ENCOUNTER — Encounter: Payer: Self-pay | Admitting: Nurse Practitioner

## 2013-07-01 DIAGNOSIS — K219 Gastro-esophageal reflux disease without esophagitis: Secondary | ICD-10-CM

## 2013-07-01 DIAGNOSIS — R1319 Other dysphagia: Secondary | ICD-10-CM

## 2013-07-01 DIAGNOSIS — E559 Vitamin D deficiency, unspecified: Secondary | ICD-10-CM

## 2013-07-01 DIAGNOSIS — R070 Pain in throat: Secondary | ICD-10-CM

## 2013-07-01 DIAGNOSIS — I10 Essential (primary) hypertension: Secondary | ICD-10-CM

## 2013-07-01 DIAGNOSIS — I69959 Hemiplegia and hemiparesis following unspecified cerebrovascular disease affecting unspecified side: Secondary | ICD-10-CM

## 2013-07-01 NOTE — Progress Notes (Signed)
Patient ID: Philip [name], male   DOB: Nov 26, 1950, 63 y.o.   MRN: [id number]    [facility]   Place of Service: SNF (31)  PCP: Terald SleeperOBSON,MICHAEL GAVIN, MD  No Known Allergies  Chief Complaint  Patient presents with  . 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   Review of Systems:  Unable to obtain   Past Medical History  Diagnosis Date  . CVA (cerebral vascular accident)   . Hemiplegia affecting dominant side, late effect of cerebrovascular disease   . Vitamin D deficiency   . Essential hypertension, benign   . GERD (gastroesophageal reflux disease)   . Anemia   . Thyroid disease   . Pneumonia   . DYSPHAGIA 07/09/2008    Qualifier: Diagnosis of  By: Christella HartiganJacobs MD, Melton Alaraniel P    No past surgical history on file. Social History:   has no tobacco, alcohol, and drug history on file.  No family history on file.  Medications: Patient's Medications  New Prescriptions   No medications on file  Previous Medications   ACETAMINOPHEN (TYLENOL) 500 MG TABLET    Take 1,000 mg by mouth 2 (two) times daily.   BACLOFEN (LIORESAL) 10 MG TABLET    Take 5 mg by mouth 3 (three) times daily.   CHOLECALCIFEROL (VITAMIN D) 1000 UNITS TABLET    Take 2,000 Units by mouth daily.   LABETALOL (NORMODYNE) 100 MG TABLET    Take 100 mg by mouth daily.   OMEPRAZOLE (PRILOSEC) 20 MG CAPSULE    Take 1 capsule (20 mg total) by mouth daily.   RAMIPRIL (ALTACE) 5 MG CAPSULE    Take 5 mg by mouth daily.  Modified Medications   No medications on file  Discontinued Medications   No medications on file     Physical Exam:   Filed Vitals:   07/01/13 1433  BP: 106/70  Pulse: 65  Temp: 97 F (36.1 C)  Resp: 18       Physical Exam  Constitutional: He is well-developed, well-nourished, and in no distress.  HENT:  Head: Normocephalic and atraumatic.  Eyes: Conjunctivae and EOM are normal. Pupils are equal, round, and reactive to light.  Neck: Normal range of motion. Neck supple. No thyromegaly present.  Cardiovascular: Normal rate, regular rhythm and normal heart sounds.   Pulmonary/Chest: Effort normal and breath sounds normal.  Abdominal: Soft. Bowel sounds are normal.  Musculoskeletal: He exhibits no edema and no tenderness.  Lymphadenopathy:    He has no cervical adenopathy.  Neurological: He is alert.  Right sided weakess  Skin: Skin is warm and dry.  Psychiatric: Affect normal.     Labs reviewed: 08-08-11: tsh 0.619; FTI 3.5; thyroglobulin 3.0; thyroid antibodies <20.0; tpo <10.0; free t3 2.9;  free t4: 1.26  09-10-12: wbc 6.4; hgb 10.8; hct 30.9; mcv 57.8; plt 208; glucose 80; bun 16; creat 1.01; k+4.6; na++139; liver normal albumin 3.9  03-25-13: glucose 132; bun 14; creat 1.03; k+3.8; na++ 137; liver normal albumin 4.4 PSA 0.89  04-26-13: wbc 5.8; hgb 10.6; hct 31.5 ;mcv 89.2 ;plt 220    Assessment/Plan 1. Vitamin D deficiency -on vit d daily; will follow up vit d  level  2. Hemiplegia affecting dominant side, late effect of cerebrovascular disease - remains stable; works with therapy; baclofen has been increased to TID to help with tone   3. GERD (gastroesophageal reflux disease) - will change medication to protonix twice daily for 6 weeks then decrease to once daily   4. DYSPHAGIA - stable  5. Essential hypertension, benign -stable on labetalol and altace  6. Throat pain -pt with aphagia and therefore unable to characterize, question related to GERD therefore changing medications at this time -GI consult already ordered -Cepacol spray q 4 hours while awake to see if this helps   Labs/tests ordered Fasting lipids and vit d level

## 2013-07-22 ENCOUNTER — Non-Acute Institutional Stay (SKILLED_NURSING_FACILITY): Payer: Medicare Other | Admitting: Nurse Practitioner

## 2013-07-22 DIAGNOSIS — I69959 Hemiplegia and hemiparesis following unspecified cerebrovascular disease affecting unspecified side: Secondary | ICD-10-CM

## 2013-07-22 DIAGNOSIS — K219 Gastro-esophageal reflux disease without esophagitis: Secondary | ICD-10-CM

## 2013-07-22 DIAGNOSIS — R1319 Other dysphagia: Secondary | ICD-10-CM

## 2013-07-22 DIAGNOSIS — I10 Essential (primary) hypertension: Secondary | ICD-10-CM

## 2013-07-22 DIAGNOSIS — E559 Vitamin D deficiency, unspecified: Secondary | ICD-10-CM

## 2013-07-22 NOTE — Progress Notes (Signed)
Patient ID: Philip [name], male   DOB: [birth year], 63 y.o.   MRN: [id number]    Nursing Home Location:  [facility]   Place of Service: SNF (31)  PCP: Terald Sleeper, MD  No Known Allergies  Chief Complaint  Patient presents with  . Medical Managment of Chronic Issues    HPI:  63 year old male with a pmh of left basal ganglial intracerebral hemorrhage in 2009, anemia, vit D def, htn, thyroid disease and and points to neck frequently during exam; however he does not follow commands and can not shake his head yes or no when asked a question; clearly there is a communication barrier-- question if there is any pain to the neck at all or if he is just pointing to his throat because he can not speak due to CVA; nursing without concerns, pt with good PO intake and has been unchanged since the last visit  Review of Systems:  Unable due to aphagia   Past Medical History  Diagnosis Date  . CVA (cerebral vascular accident)   . Hemiplegia affecting dominant side, late effect of cerebrovascular disease   . Vitamin D deficiency   . Essential hypertension, benign   . GERD (gastroesophageal reflux disease)   . Anemia   . Thyroid disease   . Pneumonia   . DYSPHAGIA 07/09/2008    Qualifier: Diagnosis of  By: Christella Hartigan MD, Melton Alar    No past surgical history on file. Social History:   has no tobacco, alcohol, and drug history on file.  No family history on file.  Medications: Patient's Medications  New Prescriptions   No medications on file  Previous Medications   ACETAMINOPHEN (TYLENOL) 500 MG TABLET    Take 1,000 mg by mouth 2 (two) times daily.   BACLOFEN (LIORESAL) 10 MG TABLET    Take 5 mg by mouth 3 (three) times daily.   CHOLECALCIFEROL (VITAMIN D) 1000 UNITS TABLET    Take 2,000 Units by mouth daily.   LABETALOL (NORMODYNE) 100 MG TABLET    Take 100 mg by mouth daily.   PANTOPRAZOLE (PROTONIX) 40 MG TABLET    Take 40 mg by mouth daily.   RAMIPRIL (ALTACE) 5 MG  CAPSULE    Take 5 mg by mouth daily.  Modified Medications   No medications on file  Discontinued Medications   OMEPRAZOLE (PRILOSEC) 20 MG CAPSULE    Take 1 capsule (20 mg total) by mouth daily.     Physical Exam:  Filed Vitals:   07/22/13 1611  BP: 110/68  Pulse: 70  Temp: 97.7 F (36.5 C)  Resp: 20   Physical Exam  Constitutional: He is well-developed, well-nourished, and in no distress.  HENT:  Head: Normocephalic and atraumatic.  Right Ear: External ear normal.  Left Ear: External ear normal.  Nose: Nose normal.  Mouth/Throat: Oropharynx is clear and moist. No oropharyngeal exudate.  Eyes: Conjunctivae and EOM are normal. Pupils are equal, round, and reactive to light.  Neck: Normal range of motion. Neck supple. No thyromegaly present.  Cardiovascular: Normal rate, regular rhythm and normal heart sounds.   Pulmonary/Chest: Effort normal and breath sounds normal.  Abdominal: Soft. Bowel sounds are normal. He exhibits no distension. There is no tenderness.  Musculoskeletal: He exhibits no edema and no tenderness.  Lymphadenopathy:    He has no cervical adenopathy.  Neurological: He is alert.  Right sided weakess  Skin: Skin is warm and dry.  Psychiatric: Affect normal.  Labs reviewed: 08-08-11: tsh 0.619; FTI 3.5; thyroglobulin 3.0; thyroid antibodies <20.0; tpo <10.0; free t3 2.9;  free t4: 1.26  09-10-12: wbc 6.4; hgb 10.8; hct 30.9; mcv 57.8; plt 208; glucose 80; bun 16; creat 1.01; k+4.6; na++139; liver normal albumin 3.9  03-25-13: glucose 132; bun 14; creat 1.03; k+3.8; na++ 137; liver normal albumin 4.4 PSA 0.89  04-26-13: wbc 5.8; hgb 10.6; hct 31.5 ;mcv 89.2 ;plt 220    Assessment/Plan 1. DYSPHAGIA -followed by ST; on regular diet with thin liquids; tolerates well   2. Essential hypertension, benign -Patient is stable; continue current regimen. Will monitor and make changes as necessary. -follow up cmp  3. GERD (gastroesophageal reflux  disease) -conts on protonix 40 mg daily  4. Hemiplegia affecting dominant side, late effect of cerebrovascular disease -conts to work with therapy  5. Vitamin D deficiency -Vit D level is stable at 46  6. Anemia -will get cbc with total iron, TIBC %sat and ferritin level

## 2013-08-26 ENCOUNTER — Non-Acute Institutional Stay (SKILLED_NURSING_FACILITY): Payer: Medicare Other | Admitting: Nurse Practitioner

## 2013-08-26 DIAGNOSIS — R1319 Other dysphagia: Secondary | ICD-10-CM

## 2013-08-26 DIAGNOSIS — D649 Anemia, unspecified: Secondary | ICD-10-CM

## 2013-08-26 DIAGNOSIS — I10 Essential (primary) hypertension: Secondary | ICD-10-CM

## 2013-08-26 DIAGNOSIS — K219 Gastro-esophageal reflux disease without esophagitis: Secondary | ICD-10-CM

## 2013-08-26 DIAGNOSIS — I69959 Hemiplegia and hemiparesis following unspecified cerebrovascular disease affecting unspecified side: Secondary | ICD-10-CM

## 2013-08-26 NOTE — Progress Notes (Signed)
Patient ID: Philip [name], male   DOB: 05-01-1951, 63 y.o.   MRN: [id number]    [facility]   Place of Service: SNF (31)  PCP: Terald SleeperOBSON,MICHAEL GAVIN, MD  No Known Allergies  Chief Complaint  Patient presents with  . Medical Managment of Chronic Issues    HPI:  63 year old male with a pmh of left basal ganglial intracerebral hemorrhage in 2009, anemia, vit D def, htn, thyroid disease and there has been no changes in the last month; nursing without concerns.   Review of Systems:  Unable to obtain due to aphagia   Past Medical History  Diagnosis Date  . CVA (cerebral vascular accident)   . Hemiplegia affecting dominant side, late effect of cerebrovascular disease   . Vitamin D deficiency   . Essential hypertension, benign   . GERD (gastroesophageal reflux disease)   . Anemia   . Thyroid disease   . Pneumonia   . DYSPHAGIA 07/09/2008    Qualifier: Diagnosis of  By: Christella HartiganJacobs MD, Melton Alaraniel P    No past surgical history on file. Social History:   has no tobacco, alcohol, and drug history on file.  No family history on file.  Medications: Patient's Medications  New Prescriptions   No medications on file  Previous Medications   ACETAMINOPHEN (TYLENOL) 500 MG TABLET    Take 1,000 mg by mouth 2 (two) times daily.   BACLOFEN (LIORESAL) 10 MG TABLET    Take 5 mg by mouth 3 (three) times daily.   CHOLECALCIFEROL (VITAMIN D) 1000 UNITS TABLET    Take 2,000 Units by mouth daily.   LABETALOL (NORMODYNE) 100 MG TABLET    Take 100 mg by mouth daily.   PANTOPRAZOLE (PROTONIX) 40 MG TABLET    Take 40 mg by mouth daily.   RAMIPRIL (ALTACE) 5 MG CAPSULE    Take 5 mg by mouth daily.  Modified Medications   No medications on file  Discontinued Medications   No medications on file     Physical Exam:  Filed Vitals:   08/26/13 1058  BP: 138/72  Pulse: 84  Temp: 98.8 F (37.1 C)  Resp: 20    Physical Exam  Constitutional: He is well-developed,  well-nourished, and in no distress.  HENT:  Head: Normocephalic and atraumatic.  Eyes: Conjunctivae and EOM are normal. Pupils are equal, round, and reactive to light.  Neck: Normal range of motion. Neck supple. No thyromegaly present.  Cardiovascular: Normal rate, regular rhythm and normal heart sounds.   Pulmonary/Chest: Effort normal and breath sounds normal.  Abdominal: Soft. Bowel sounds are normal. He exhibits no distension. There is no tenderness.  Musculoskeletal: He exhibits no edema.  Lymphadenopathy:    He has no cervical adenopathy.  Neurological: He is alert.  Right sided weakess  Skin: Skin is warm and dry.     Labs reviewed: 08-08-11: tsh 0.619; FTI 3.5; thyroglobulin 3.0; thyroid antibodies <20.0; tpo <10.0; free t3 2.9;  free t4: 1.26  09-10-12: wbc 6.4; hgb 10.8; hct 30.9; mcv 57.8; plt 208; glucose 80; bun 16; creat 1.01; k+4.6; na++139; liver normal albumin 3.9  03-25-13: glucose 132; bun 14; creat 1.03; k+3.8; na++ 137; liver normal albumin 4.4 PSA 0.89  04-26-13: wbc 5.8; hgb 10.6; hct 31.5 ;mcv 89.2 ;plt 220  Iron and IBC    Result: 07/24/2013 11:21 AM   ( Status: F )     C Iron 61     42-165 ug/dL SLN  UIBC 202     125-400 ug/dL SLN   TIBC 161     096-045 ug/dL SLN   %SAT 23     40-98 % SLN   CBC NO Diff (Complete Blood Count)    Result: 07/24/2013 10:22 AM   ( Status: F )       WBC 6.0     4.0-10.5 K/uL SLN   RBC 5.28     4.22-5.81 MIL/uL SLN   Hemoglobin 10.5   L 13.0-17.0 g/dL SLN   Hematocrit 11.9   L 39.0-52.0 % SLN   MCV 59.1   L 78.0-100.0 fL SLN   MCH 19.9   L 26.0-34.0 pg SLN   MCHC 33.7     30.0-36.0 g/dL SLN   RDW 14.7   H 82.9-56.2 % SLN   Platelet Count 215     150-400 K/uL SLN   Comprehensive Metabolic Panel    Result: 07/24/2013 11:21 AM   ( Status: F )       Sodium 141     135-145 mEq/L SLN   Potassium 4.2     3.5-5.3 mEq/L SLN   Chloride 105     96-112 mEq/L SLN   CO2 27     19-32 mEq/L SLN   Glucose 77     70-99 mg/dL SLN   BUN 18       1-30 mg/dL SLN   Creatinine 8.65     0.50-1.35 mg/dL SLN   Bilirubin, Total 0.5     0.2-1.2 mg/dL SLN C Alkaline Phosphatase 77     39-117 U/L SLN   AST/SGOT 14     0-37 U/L SLN   ALT/SGPT 10     0-53 U/L SLN   Total Protein 6.7     6.0-8.3 g/dL SLN   Albumin 3.7     7.8-4.6 g/dL SLN   Calcium 9.2     9.6-29.5 mg/dL SLN   TSH, Ultrasensitive    Result: 07/24/2013 2:12 PM   ( Status: F )       TSH 0.681     0.350-4.500 uIU/mL SLN  Assessment/Plan 1. Hemiplegia affecting dominant side, late effect of cerebrovascular disease -stable; conts baclofen  2. GERD (gastroesophageal reflux disease) -stable on protonix  3. DYSPHAGIA -remains stable  4. Essential hypertension, benign -well controlled on altace  5. Anemia -hgb remains stable

## 2013-09-30 ENCOUNTER — Non-Acute Institutional Stay (SKILLED_NURSING_FACILITY): Payer: Medicare Other | Admitting: Nurse Practitioner

## 2013-09-30 DIAGNOSIS — E785 Hyperlipidemia, unspecified: Secondary | ICD-10-CM

## 2013-09-30 DIAGNOSIS — K219 Gastro-esophageal reflux disease without esophagitis: Secondary | ICD-10-CM

## 2013-09-30 DIAGNOSIS — R1319 Other dysphagia: Secondary | ICD-10-CM

## 2013-09-30 DIAGNOSIS — I69959 Hemiplegia and hemiparesis following unspecified cerebrovascular disease affecting unspecified side: Secondary | ICD-10-CM

## 2013-09-30 DIAGNOSIS — I10 Essential (primary) hypertension: Secondary | ICD-10-CM

## 2013-09-30 NOTE — Progress Notes (Signed)
Patient ID: Philip [name], male   DOB: 1951/05/21, 63 y.o.   MRN: [id number]    [facility]   Place of Service: SNF (31)  PCP: Terald SleeperOBSON,MICHAEL GAVIN, MD  No Known Allergies  Chief Complaint  Patient presents with  . Medical Managment of Chronic Issues    HPI:  63 year old male with a pmh of left basal ganglial intracerebral hemorrhage in 2009, anemia, vit D def, htn, thyroid disease and there has been no significant changes in chronic conditions over the past month; nursing without concerns. Pt doing well in current environment  Review of Systems:  Unable to provide ROS due to aphagia   Past Medical History  Diagnosis Date  . CVA (cerebral vascular accident)   . Hemiplegia affecting dominant side, late effect of cerebrovascular disease   . Vitamin D deficiency   . Essential hypertension, benign   . GERD (gastroesophageal reflux disease)   . Anemia   . Thyroid disease   . Pneumonia   . DYSPHAGIA 07/09/2008    Qualifier: Diagnosis of  By: Christella HartiganJacobs MD, Melton Alaraniel P    No past surgical history on file. Social History:   has no tobacco, alcohol, and drug history on file.  No family history on file.  Medications: Patient's Medications  New Prescriptions   No medications on file  Previous Medications   ACETAMINOPHEN (TYLENOL) 500 MG TABLET    Take 1,000 mg by mouth 2 (two) times daily.   BACLOFEN (LIORESAL) 10 MG TABLET    Take 5 mg by mouth 3 (three) times daily.   CHOLECALCIFEROL (VITAMIN D) 1000 UNITS TABLET    Take 2,000 Units by mouth daily.   LABETALOL (NORMODYNE) 100 MG TABLET    Take 100 mg by mouth daily.   PANTOPRAZOLE (PROTONIX) 40 MG TABLET    Take 40 mg by mouth daily.   RAMIPRIL (ALTACE) 5 MG CAPSULE    Take 5 mg by mouth daily.  Modified Medications   No medications on file  Discontinued Medications   No medications on file     Physical Exam:  Filed Vitals:   09/30/13 1331  BP: 127/76  Pulse: 75  Temp: 97 F (36.1 C)   Resp: 20  Weight: 120 lb (54.432 kg)    Physical Exam  Constitutional: He is well-developed, well-nourished, and in no distress.  HENT:  Head: Normocephalic and atraumatic.  Eyes: Conjunctivae and EOM are normal. Pupils are equal, round, and reactive to light.  Neck: Normal range of motion. Neck supple. No thyromegaly present.  Cardiovascular: Normal rate, regular rhythm and normal heart sounds.   Pulmonary/Chest: Effort normal and breath sounds normal.  Abdominal: Soft. Bowel sounds are normal. He exhibits no distension. There is no tenderness.  Musculoskeletal: He exhibits no edema.  Lymphadenopathy:    He has no cervical adenopathy.  Neurological: He is alert.  Right sided weakess  Skin: Skin is warm and dry.     Labs reviewed: 08-08-11: tsh 0.619; FTI 3.5; thyroglobulin 3.0; thyroid antibodies <20.0; tpo <10.0; free t3 2.9;  free t4: 1.26  09-10-12: wbc 6.4; hgb 10.8; hct 30.9; mcv 57.8; plt 208; glucose 80; bun 16; creat 1.01; k+4.6; na++139; liver normal albumin 3.9  03-25-13: glucose 132; bun 14; creat 1.03; k+3.8; na++ 137; liver normal albumin 4.4 PSA 0.89  04-26-13: wbc 5.8; hgb 10.6; hct 31.5 ;mcv 89.2 ;plt 220  Iron and IBC  Result: 07/24/2013 11:21 AM ( Status: F ) C  Iron 61 42-165 ug/dL SLN  UIBC 161202 096-045125-400 ug/dL SLN  TIBC 409263 811-914215-435 ug/dL SLN  %SAT 23 78-2920-55 % SLN  CBC NO Diff (Complete Blood Count)  Result: 07/24/2013 10:22 AM ( Status: F )  WBC 6.0 4.0-10.5 K/uL SLN  RBC 5.28 4.22-5.81 MIL/uL SLN  Hemoglobin 10.5 L 13.0-17.0 g/dL SLN  Hematocrit 56.231.2 L 39.0-52.0 % SLN  MCV 59.1 L 78.0-100.0 fL SLN  MCH 19.9 L 26.0-34.0 pg SLN  MCHC 33.7 30.0-36.0 g/dL SLN  RDW 13.016.9 H 86.5-78.411.5-15.5 % SLN  Platelet Count 215 150-400 K/uL SLN  Comprehensive Metabolic Panel  Result: 07/24/2013 11:21 AM ( Status: F )  Sodium 141 135-145 mEq/L SLN  Potassium 4.2 3.5-5.3 mEq/L SLN  Chloride 105 96-112 mEq/L SLN  CO2 27 19-32 mEq/L SLN  Glucose 77 70-99 mg/dL SLN  BUN 18 6-966-23 mg/dL  SLN  Creatinine 2.950.99 2.84-1.320.50-1.35 mg/dL SLN  Bilirubin, Total 0.5 0.2-1.2 mg/dL SLN C  Alkaline Phosphatase 77 39-117 U/L SLN  AST/SGOT 14 0-37 U/L SLN  ALT/SGPT 10 0-53 U/L SLN  Total Protein 6.7 6.0-8.3 g/dL SLN  Albumin 3.7 4.4-0.13.5-5.2 g/dL SLN  Calcium 9.2 0.2-72.58.4-10.5 mg/dL SLN  TSH, Ultrasensitive  Result: 07/24/2013 2:12 PM ( Status: F )  TSH 0.681 0.350-4.500 uIU/mL SLN  Lipid Profile    Result: 07/03/2013 11:24 AM   ( Status: F )     C Cholesterol 171     0-200 mg/dL SLN C Triglyceride 89     <150 mg/dL SLN   HDL Cholesterol 38   L >39 mg/dL SLN   Total Chol/HDL Ratio 4.5      Ratio SLN   VLDL Cholesterol (Calc) 18     0-40 mg/dL SLN   LDL Cholesterol (Calc) 115   H 0-99 mg/dL SLN C Vitamin D (36-[id number]-Hydroxy)    Result: 07/03/2013 1:09 PM   ( Status: F )       Vitamin D (25-Hydroxy) 46     30-89 ng/mL SLN    Assessment/Plan 1. Essential hypertension, benign -controlled on Altace   2. DYSPHAGIA Followed by ST; without change  3. Hemiplegia affecting dominant side, late effect of cerebrovascular disease -without significant change, conts on baclofen  4. Hyperlipemia -will follow up fasting lipids, will need to be on statin if LDL remains elevated  5. GERD (gastroesophageal reflux disease) -will dc protonix and monitor for worsening symptoms

## 2013-10-21 ENCOUNTER — Non-Acute Institutional Stay (SKILLED_NURSING_FACILITY): Payer: Medicare Other | Admitting: Nurse Practitioner

## 2013-10-21 DIAGNOSIS — I10 Essential (primary) hypertension: Secondary | ICD-10-CM

## 2013-10-21 DIAGNOSIS — R1319 Other dysphagia: Secondary | ICD-10-CM

## 2013-10-21 DIAGNOSIS — I69959 Hemiplegia and hemiparesis following unspecified cerebrovascular disease affecting unspecified side: Secondary | ICD-10-CM

## 2013-10-21 DIAGNOSIS — K219 Gastro-esophageal reflux disease without esophagitis: Secondary | ICD-10-CM

## 2013-10-21 DIAGNOSIS — E785 Hyperlipidemia, unspecified: Secondary | ICD-10-CM

## 2013-10-21 NOTE — Progress Notes (Signed)
Patient ID: Philip [name], male   DOB: 1950/11/21, 63 y.o.   MRN: [id number]    Nursing Home Location:  [place] of Service: SNF (31)  PCP: Cyndee Brightly, MD  No Known Allergies  Chief Complaint  Patient presents with  . Medical Management of Chronic Issues    HPI:  63 year old male with a pmh of left basal ganglial intracerebral hemorrhage in 2009, anemia, vit D def, htn, thyroid disease and there has been no significant changes in chronic conditions over the past month; nursing without concerns. pts protonix was stopped last month, no changes noted, pt with weight gain noted.  Review of Systems:  Unable to provide ROS due to aphagia    Past Medical History  Diagnosis Date  . CVA (cerebral vascular accident)   . Hemiplegia affecting dominant side, late effect of cerebrovascular disease   . Vitamin D deficiency   . Essential hypertension, benign   . GERD (gastroesophageal reflux disease)   . Anemia   . Thyroid disease   . Pneumonia   . DYSPHAGIA 07/09/2008    Qualifier: Diagnosis of  By: Ardis Hughs MD, Melene Plan    No past surgical history on file. Social History:   has no tobacco, alcohol, and drug history on file.  No family history on file.  Medications: Patient's Medications  New Prescriptions   No medications on file  Previous Medications   ACETAMINOPHEN (TYLENOL) 500 MG TABLET    Take 1,000 mg by mouth 2 (two) times daily.   BACLOFEN (LIORESAL) 10 MG TABLET    Take 5 mg by mouth 3 (three) times daily.   CHOLECALCIFEROL (VITAMIN D) 1000 UNITS TABLET    Take 2,000 Units by mouth daily.   LABETALOL (NORMODYNE) 100 MG TABLET    Take 100 mg by mouth daily.   RAMIPRIL (ALTACE) 5 MG CAPSULE    Take 5 mg by mouth daily.  Modified Medications   No medications on file  Discontinued Medications   PANTOPRAZOLE (PROTONIX) 40 MG TABLET    Take 40 mg by mouth daily.     Physical Exam: Physical Exam  Constitutional: He is well-developed,  well-nourished, and in no distress.  HENT:  Head: Normocephalic and atraumatic.  Eyes: Conjunctivae and EOM are normal. Pupils are equal, round, and reactive to light.  Neck: Normal range of motion. Neck supple.  Cardiovascular: Normal rate, regular rhythm and normal heart sounds.   Pulmonary/Chest: Effort normal and breath sounds normal.  Abdominal: Soft. Bowel sounds are normal.  Musculoskeletal: He exhibits no edema.  Brace to right lower extremity   Neurological: He is alert.  Right sided weakess  Skin: Skin is warm and dry.     Filed Vitals:   10/21/13 1512  BP: 122/83  Pulse: 79  Temp: 98.8 F (37.1 C)  Resp: 20  Weight: 125 lb (56.7 kg)      Labs reviewed: 08-08-11: tsh 0.619; FTI 3.5; thyroglobulin 3.0; thyroid antibodies <20.0; tpo <10.0; free t3 2.9;  free t4: 1.26  09-10-12: wbc 6.4; hgb 10.8; hct 30.9; mcv 57.8; plt 208; glucose 80; bun 16; creat 1.01; k+4.6; na++139; liver normal albumin 3.9  03-25-13: glucose 132; bun 14; creat 1.03; k+3.8; na++ 137; liver normal albumin 4.4 PSA 0.89  04-26-13: wbc 5.8; hgb 10.6; hct 31.5 ;mcv 89.2 ;plt 220  Iron and IBC  Result: 07/24/2013 11:21 AM ( Status: F ) C  Iron 61 42-165 ug/dL SLN  UIBC 202 125-400 ug/dL  SLN  TIBC 263 215-435 ug/dL SLN  %SAT 23 20-55 % SLN  CBC NO Diff (Complete Blood Count)  Result: 07/24/2013 10:22 AM ( Status: F )  WBC 6.0 4.0-10.5 K/uL SLN  RBC 5.28 4.22-5.81 MIL/uL SLN  Hemoglobin 10.5 L 13.0-17.0 g/dL SLN  Hematocrit 31.2 L 39.0-52.0 % SLN  MCV 59.1 L 78.0-100.0 fL SLN  MCH 19.9 L 26.0-34.0 pg SLN  MCHC 33.7 30.0-36.0 g/dL SLN  RDW 16.9 H 11.5-15.5 % SLN  Platelet Count 215 150-400 K/uL SLN  Comprehensive Metabolic Panel  Result: 0/06/2222 11:21 AM ( Status: F )  Sodium 141 135-145 mEq/L SLN  Potassium 4.2 3.5-5.3 mEq/L SLN  Chloride 105 96-112 mEq/L SLN  CO2 27 19-32 mEq/L SLN  Glucose 77 70-99 mg/dL SLN  BUN 18 6-23 mg/dL SLN  Creatinine 0.99 0.50-1.35 mg/dL SLN  Bilirubin, Total 0.5  0.2-1.2 mg/dL SLN C  Alkaline Phosphatase 77 39-117 U/L SLN  AST/SGOT 14 0-37 U/L SLN  ALT/SGPT 10 0-53 U/L SLN  Total Protein 6.7 6.0-8.3 g/dL SLN  Albumin 3.7 3.5-5.2 g/dL SLN  Calcium 9.2 8.4-10.5 mg/dL SLN  TSH, Ultrasensitive  Result: 07/24/2013 2:12 PM ( Status: F )  TSH 0.681 0.350-4.500 uIU/mL SLN  Lipid Profile  Result: 07/03/2013 11:24 AM ( Status: F ) C  Cholesterol 171 0-200 mg/dL SLN C  Triglyceride 89 <150 mg/dL SLN  HDL Cholesterol 38 L >39 mg/dL SLN  Total Chol/HDL Ratio 4.5 Ratio SLN  VLDL Cholesterol (Calc) 18 0-40 mg/dL SLN  LDL Cholesterol (Calc) 115 H 0-99 mg/dL SLN C  Vitamin D (25-Hydroxy)  Result: 07/03/2013 1:09 PM ( Status: F )  Vitamin D (25-Hydroxy) 46 30-89 ng/mL SLN    Assessment/Plan 1. DYSPHAGIA -goals met with ST, without new concerns.   2. Hemiplegia affecting dominant side, late effect of cerebrovascular disease -conts on baclofen, without changes, currently off therapies  3. Hyperlipemia Will reorder fasting lipids next month they have not been drawn from previous order  4. Essential hypertension, benign -blood pressure stable  5. GERD -off protonix, without changes

## 2013-11-18 ENCOUNTER — Non-Acute Institutional Stay (SKILLED_NURSING_FACILITY): Payer: Medicare Other | Admitting: Nurse Practitioner

## 2013-11-18 DIAGNOSIS — D649 Anemia, unspecified: Secondary | ICD-10-CM

## 2013-11-18 DIAGNOSIS — R1319 Other dysphagia: Secondary | ICD-10-CM

## 2013-11-18 DIAGNOSIS — I69959 Hemiplegia and hemiparesis following unspecified cerebrovascular disease affecting unspecified side: Secondary | ICD-10-CM

## 2013-11-18 DIAGNOSIS — E785 Hyperlipidemia, unspecified: Secondary | ICD-10-CM

## 2013-11-18 DIAGNOSIS — I10 Essential (primary) hypertension: Secondary | ICD-10-CM

## 2013-11-18 NOTE — Progress Notes (Signed)
Patient ID: Philip [name], male   DOB: 11/18/1950, 63 y.o.   MRN: [id number]    Nursing Home Location:  [facility]   Place of Service: SNF (31)  PCP: Terald Sleeper, MD  No Known Allergies  Chief Complaint  Patient presents with  . Medical Management of Chronic Issues    HPI:  63 year old male with a pmh of left basal ganglial intracerebral hemorrhage in 2009, anemia, vit D def, htn, thyroid disease who is being seen today for routine follow up on chronic conditions. Pt has been doing well in the last month without significant changes; nursing without concerns pt eating well, no changes in bowel or bladder. No shortness of breath noted.   Review of Systems:  Unable to provide ROS due to aphagia    Past Medical History  Diagnosis Date  . CVA (cerebral vascular accident)   . Hemiplegia affecting dominant side, late effect of cerebrovascular disease   . Vitamin D deficiency   . Essential hypertension, benign   . GERD (gastroesophageal reflux disease)   . Anemia   . Thyroid disease   . Pneumonia   . DYSPHAGIA 07/09/2008    Qualifier: Diagnosis of  By: Christella Hartigan MD, Melton Alar    No past surgical history on file. Social History:   has no tobacco, alcohol, and drug history on file.  No family history on file.  Medications: Patient's Medications  New Prescriptions   No medications on file  Previous Medications   ACETAMINOPHEN (TYLENOL) 500 MG TABLET    Take 1,000 mg by mouth 2 (two) times daily.   BACLOFEN (LIORESAL) 10 MG TABLET    Take 5 mg by mouth 3 (three) times daily.   CHOLECALCIFEROL (VITAMIN D) 1000 UNITS TABLET    Take 2,000 Units by mouth daily.   LABETALOL (NORMODYNE) 100 MG TABLET    Take 100 mg by mouth daily.   RAMIPRIL (ALTACE) 5 MG CAPSULE    Take 5 mg by mouth daily.  Modified Medications   No medications on file  Discontinued Medications   No medications on file     Physical Exam:  Filed Vitals:   11/18/13 1336  BP: 157/92    Pulse: 72  Temp: 97.8 F (36.6 C)  Resp: 18  Weight: 125 lb (56.7 kg)    Physical Exam  Constitutional: He is well-developed, well-nourished, and in no distress.  HENT:  Head: Normocephalic and atraumatic.  Eyes: Conjunctivae and EOM are normal. Pupils are equal, round, and reactive to light.  Neck: Normal range of motion. Neck supple.  Cardiovascular: Normal rate, regular rhythm and normal heart sounds.   Pulmonary/Chest: Effort normal and breath sounds normal.  Abdominal: Soft. Bowel sounds are normal.  Musculoskeletal: He exhibits no edema and no tenderness.  Brace to right lower extremity right sided hemiparesis   Neurological: He is alert.  Skin: Skin is warm and dry.  Psychiatric: Affect normal.     Labs reviewed: 08-08-11: tsh 0.619; FTI 3.5; thyroglobulin 3.0; thyroid antibodies <20.0; tpo <10.0; free t3 2.9;  free t4: 1.26  09-10-12: wbc 6.4; hgb 10.8; hct 30.9; mcv 57.8; plt 208; glucose 80; bun 16; creat 1.01; k+4.6; na++139; liver normal albumin 3.9  03-25-13: glucose 132; bun 14; creat 1.03; k+3.8; na++ 137; liver normal albumin 4.4 PSA 0.89  04-26-13: wbc 5.8; hgb 10.6; hct 31.5 ;mcv 89.2 ;plt 220  Iron and IBC  Result: 07/24/2013 11:21 AM ( Status: F ) C  Iron 61 42-165  ug/dL SLN  UIBC 829202 562-130125-400 ug/dL SLN  TIBC 865263 784-696215-435 ug/dL SLN  %SAT 23 29-5220-55 % SLN  CBC NO Diff (Complete Blood Count)  Result: 07/24/2013 10:22 AM ( Status: F )  WBC 6.0 4.0-10.5 K/uL SLN  RBC 5.28 4.22-5.81 MIL/uL SLN  Hemoglobin 10.5 L 13.0-17.0 g/dL SLN  Hematocrit 84.131.2 L 39.0-52.0 % SLN  MCV 59.1 L 78.0-100.0 fL SLN  MCH 19.9 L 26.0-34.0 pg SLN  MCHC 33.7 30.0-36.0 g/dL SLN  RDW 32.416.9 H 40.1-02.711.5-15.5 % SLN  Platelet Count 215 150-400 K/uL SLN  Comprehensive Metabolic Panel  Result: 07/24/2013 11:21 AM ( Status: F )  Sodium 141 135-145 mEq/L SLN  Potassium 4.2 3.5-5.3 mEq/L SLN  Chloride 105 96-112 mEq/L SLN  CO2 27 19-32 mEq/L SLN  Glucose 77 70-99 mg/dL SLN  BUN 18 2-536-23 mg/dL SLN   Creatinine 6.640.99 4.03-4.740.50-1.35 mg/dL SLN  Bilirubin, Total 0.5 0.2-1.2 mg/dL SLN C  Alkaline Phosphatase 77 39-117 U/L SLN  AST/SGOT 14 0-37 U/L SLN  ALT/SGPT 10 0-53 U/L SLN  Total Protein 6.7 6.0-8.3 g/dL SLN  Albumin 3.7 2.5-9.53.5-5.2 g/dL SLN  Calcium 9.2 6.3-87.58.4-10.5 mg/dL SLN  TSH, Ultrasensitive  Result: 07/24/2013 2:12 PM ( Status: F )  TSH 0.681 0.350-4.500 uIU/mL SLN  Lipid Profile  Result: 07/03/2013 11:24 AM ( Status: F ) C  Cholesterol 171 0-200 mg/dL SLN C  Triglyceride 89 <150 mg/dL SLN  HDL Cholesterol 38 L >39 mg/dL SLN  Total Chol/HDL Ratio 4.5 Ratio SLN  VLDL Cholesterol (Calc) 18 6-430-40 mg/dL SLN  LDL Cholesterol (Calc) 115 H 0-99 mg/dL SLN C  Vitamin D (32-RJJOACZ25-Hydroxy)  Result: 07/03/2013 1:09 PM ( Status: F )  Vitamin D (25-Hydroxy) 46 30-89 ng/mL SLN     Assessment/Plan 1. Essential hypertension, benign -conts on altace, will follow up cmp  2. DYSPHAGIA -stable at this time, without signs of aspiration   3. Hemiplegia affecting dominant side, late effect of cerebrovascular disease -stable, conts on baclofen, following with OT  4. Anemia -will follow up cbc  5. Hyperlipemia -will re-orderfollow up fasting lipids as they have still not been drawn

## 2013-12-23 ENCOUNTER — Non-Acute Institutional Stay (SKILLED_NURSING_FACILITY): Payer: Medicare Other | Admitting: Nurse Practitioner

## 2013-12-23 DIAGNOSIS — D649 Anemia, unspecified: Secondary | ICD-10-CM

## 2013-12-23 DIAGNOSIS — E559 Vitamin D deficiency, unspecified: Secondary | ICD-10-CM

## 2013-12-23 DIAGNOSIS — E785 Hyperlipidemia, unspecified: Secondary | ICD-10-CM

## 2013-12-23 DIAGNOSIS — I10 Essential (primary) hypertension: Secondary | ICD-10-CM

## 2013-12-23 DIAGNOSIS — I69959 Hemiplegia and hemiparesis following unspecified cerebrovascular disease affecting unspecified side: Secondary | ICD-10-CM

## 2013-12-23 NOTE — Progress Notes (Signed)
Patient ID: Philip [name], male   DOB: October 01, 1950, 63 y.o.   MRN: [id number]    Nursing Home Location:  [place] of Service: SNF (31)  PCP: Cyndee Brightly, MD  No Known Allergies  Chief Complaint  Patient presents with  . 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 Pt has been doing well in the last month without significant changes, has not been taking baclofen routinely now with increased muscle spasm but improves when taking medication Review of Systems:  Unable to provide ROS due to aphagia    Past Medical History  Diagnosis Date  . CVA (cerebral vascular accident)   . Hemiplegia affecting dominant side, late effect of cerebrovascular disease   . Vitamin D deficiency   . Essential hypertension, benign   . GERD (gastroesophageal reflux disease)   . Anemia   . Thyroid disease   . Pneumonia   . DYSPHAGIA 07/09/2008    Qualifier: Diagnosis of  By: Ardis Hughs MD, Melene Plan    No past surgical history on file. Social History:   has no tobacco, alcohol, and drug history on file.  No family history on file.  Medications: Patient's Medications  New Prescriptions   No medications on file  Previous Medications   ACETAMINOPHEN (TYLENOL) 500 MG TABLET    Take 1,000 mg by mouth 2 (two) times daily.   BACLOFEN (LIORESAL) 10 MG TABLET    Take 5 mg by mouth 3 (three) times daily.   CHOLECALCIFEROL (VITAMIN D) 1000 UNITS TABLET    Take 2,000 Units by mouth daily.   LABETALOL (NORMODYNE) 100 MG TABLET    Take 100 mg by mouth daily.   RAMIPRIL (ALTACE) 5 MG CAPSULE    Take 5 mg by mouth daily.  Modified Medications   No medications on file  Discontinued Medications   No medications on file     Physical Exam:  Filed Vitals:   12/23/13 1330  BP: 126/77  Pulse: 76  Temp: 97.6 F (36.4 C)  Resp: 18     Physical Exam  Constitutional: He is well-developed, well-nourished, and in no distress.  HENT:  Head: Normocephalic and atraumatic.  Eyes: Conjunctivae and EOM are normal. Pupils are equal, round, and reactive to light.  Neck: Normal range of motion. Neck supple.  Cardiovascular: Normal rate, regular rhythm and normal heart sounds.   Pulmonary/Chest: Effort normal and breath sounds normal.  Abdominal: Soft. Bowel sounds are normal.  Musculoskeletal: He exhibits no edema and no tenderness.  Brace to right lower extremity right sided hemiparesis   Neurological: He is alert.  Skin: Skin is warm and dry.  Psychiatric: Affect normal.    Labs reviewed: 08-08-11: tsh 0.619; FTI 3.5; thyroglobulin 3.0; thyroid antibodies <20.0; tpo <10.0; free t3 2.9;  free t4: 1.26  09-10-12: wbc 6.4; hgb 10.8; hct 30.9; mcv 57.8; plt 208; glucose 80; bun 16; creat 1.01; k+4.6; na++139; liver normal albumin 3.9  03-25-13: glucose 132; bun 14; creat 1.03; k+3.8; na++ 137; liver normal albumin 4.4 PSA 0.89  04-26-13: wbc 5.8; hgb 10.6; hct 31.5 ;mcv 89.2 ;plt 220  Iron and IBC  Result: 07/24/2013 11:21 AM ( Status: F ) C  Iron 61 42-165 ug/dL SLN  UIBC 202  125-400 ug/dL SLN  TIBC 263 215-435 ug/dL SLN  %SAT 23 20-55 % SLN  CBC NO Diff (Complete Blood Count)  Result: 07/24/2013 10:22 AM ( Status: F )  WBC 6.0 4.0-10.5 K/uL SLN  RBC 5.28 4.22-5.81 MIL/uL SLN  Hemoglobin 10.5 L 13.0-17.0 g/dL SLN  Hematocrit 31.2 L 39.0-52.0 % SLN  MCV 59.1 L 78.0-100.0 fL SLN  MCH 19.9 L 26.0-34.0 pg SLN  MCHC 33.7 30.0-36.0 g/dL SLN  RDW 16.9 H 11.5-15.5 % SLN  Platelet Count 215 150-400 K/uL SLN  Comprehensive Metabolic Panel  Result: 10/24/8976 11:21 AM ( Status: F )  Sodium 141 135-145 mEq/L SLN  Potassium 4.2 3.5-5.3 mEq/L SLN  Chloride 105 96-112 mEq/L SLN  CO2 27 19-32 mEq/L SLN  Glucose 77 70-99 mg/dL SLN  BUN 18 6-23 mg/dL SLN    Creatinine 0.99 0.50-1.35 mg/dL SLN  Bilirubin, Total 0.5 0.2-1.2 mg/dL SLN C  Alkaline Phosphatase 77 39-117 U/L SLN  AST/SGOT 14 0-37 U/L SLN  ALT/SGPT 10 0-53 U/L SLN  Total Protein 6.7 6.0-8.3 g/dL SLN  Albumin 3.7 3.5-5.2 g/dL SLN  Calcium 9.2 8.4-10.5 mg/dL SLN  TSH, Ultrasensitive  Result: 07/24/2013 2:12 PM ( Status: F )  TSH 0.681 0.350-4.500 uIU/mL SLN  Lipid Profile  Result: 07/03/2013 11:24 AM ( Status: F ) C  Cholesterol 171 0-200 mg/dL SLN C  Triglyceride 89 <150 mg/dL SLN  HDL Cholesterol 38 L >39 mg/dL SLN  Total Chol/HDL Ratio 4.5 Ratio SLN  VLDL Cholesterol (Calc) 18 0-40 mg/dL SLN  LDL Cholesterol (Calc) 115 H 0-99 mg/dL SLN C  Vitamin D (25-Hydroxy)  Result: 07/03/2013 1:09 PM ( Status: F )  Vitamin D (25-Hydroxy) 46 30-89 ng/mL SLN CBC with Diff    Result: 11/20/2013 10:41 AM   ( Status: F )     C WBC 7.6     4.0-10.5 K/uL SLN   RBC 5.72     4.22-5.81 MIL/uL SLN   Hemoglobin 11.5   L 13.0-17.0 g/dL SLN   Hematocrit 33.1   L 39.0-52.0 % SLN   MCV 57.9   L 78.0-100.0 fL SLN   MCH 20.1   L 26.0-34.0 pg SLN   MCHC 34.7     30.0-36.0 g/dL SLN   RDW 17.1   H 11.5-15.5 % SLN   Platelet Count 245     150-400 K/uL SLN   Granulocyte % 65     43-77 % SLN   Absolute Gran 4.9     1.7-7.7 K/uL SLN   Lymph % 21     12-46 % SLN   Absolute Lymph 1.6     0.7-4.0 K/uL SLN   Mono % 9     3-12 % SLN   Absolute Mono 0.7     0.1-1.0 K/uL SLN   Eos % 5     0-5 % SLN   Absolute Eos 0.4     0.0-0.7 K/uL SLN   Baso % 0     0-1 % SLN   Absolute Baso 0.0     0.0-0.1 K/uL SLN   Smear Review Criteria for review not met  SLN   Comprehensive Metabolic Panel    Result: 11/20/2013 10:52 AM   ( Status: F )       Sodium 140     135-145 mEq/L SLN   Potassium 4.3     3.5-5.3 mEq/L SLN   Chloride 104     96-112 mEq/L SLN   CO2 26  19-32 mEq/L SLN   Glucose 71     70-99 mg/dL SLN   BUN 17     6-23 mg/dL SLN   Creatinine 0.96     0.50-1.35 mg/dL SLN   Bilirubin, Total 0.8      0.2-1.2 mg/dL SLN   Alkaline Phosphatase 62     39-117 U/L SLN   AST/SGOT 13     0-37 U/L SLN   ALT/SGPT 15     0-53 U/L SLN   Total Protein 7.3     6.0-8.3 g/dL SLN   Albumin 4.1     3.5-5.2 g/dL SLN   Calcium 9.2     8.4-10.5 mg/dL SLN   Lipid Profile    Result: 11/20/2013 10:52 AM   ( Status: F )       Cholesterol 175     0-200 mg/dL SLN C Triglyceride 137     <150 mg/dL SLN   HDL Cholesterol 45     >39 mg/dL SLN   Total Chol/HDL Ratio 3.9      Ratio SLN   VLDL Cholesterol (Calc) 27     0-40 mg/dL SLN   LDL Cholesterol (Calc) 103   H 0-99 mg/dL SLN    Assessment/Plan 1. Essential hypertension, benign -stable on current medication  2. Anemia, unspecified anemia type -hgb remains stable  3. Vitamin D deficiency - conts on vit d supplement  4. Hyperlipemia LDL elevated however has improved from last lab, will cont to monitor  5. Hemiplegia affecting dominant side, late effect of cerebrovascular disease  -pt was not routinely taking baclofen as ordered now with increased muscle spasms, medication effective when taken

## 2014-02-03 ENCOUNTER — Non-Acute Institutional Stay (SKILLED_NURSING_FACILITY): Payer: Medicare Other | Admitting: Nurse Practitioner

## 2014-02-03 DIAGNOSIS — R1319 Other dysphagia: Secondary | ICD-10-CM

## 2014-02-03 DIAGNOSIS — I69959 Hemiplegia and hemiparesis following unspecified cerebrovascular disease affecting unspecified side: Secondary | ICD-10-CM

## 2014-02-03 DIAGNOSIS — I10 Essential (primary) hypertension: Secondary | ICD-10-CM

## 2014-02-03 DIAGNOSIS — K219 Gastro-esophageal reflux disease without esophagitis: Secondary | ICD-10-CM

## 2014-02-03 NOTE — Progress Notes (Signed)
Patient ID: Philip [name], male   DOB: 1950/10/18, 63 y.o.   MRN: [id number]   Nursing Home Location:  [place] of Service: SNF (31)  PCP: Cyndee Brightly, MD  No Known Allergies  Chief Complaint  Patient presents with  . 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 Staff reports increase complaints of muscle pain in right lower leg and toe. Pt points to it freq during routine care.  Review of Systems:  Unable to provide ROS due to aphagia    Past Medical History  Diagnosis Date  . CVA (cerebral vascular accident)   . Hemiplegia affecting dominant side, late effect of cerebrovascular disease   . Vitamin D deficiency   . Essential hypertension, benign   . GERD (gastroesophageal reflux disease)   . Anemia   . Thyroid disease   . Pneumonia   . DYSPHAGIA 07/09/2008    Qualifier: Diagnosis of  By: Ardis Hughs MD, Melene Plan    No past surgical history on file. Social History:   has no tobacco, alcohol, and drug history on file.  No family history on file.  Medications: Patient's Medications  New Prescriptions   No medications on file  Previous Medications   ACETAMINOPHEN (TYLENOL) 500 MG TABLET    Take 1,000 mg by mouth 2 (two) times daily.   BACLOFEN (LIORESAL) 10 MG TABLET    Take 5 mg by mouth 3 (three) times daily.   CHOLECALCIFEROL (VITAMIN D) 1000 UNITS TABLET    Take 2,000 Units by mouth daily.   LABETALOL (NORMODYNE) 100 MG TABLET    Take 100 mg by mouth daily.   RAMIPRIL (ALTACE) 5 MG CAPSULE    Take 5 mg by mouth daily.  Modified Medications   No medications on file  Discontinued Medications   No medications on file     Physical Exam:  Filed Vitals:   02/03/14 1427  BP: 113/74  Pulse: 72  Temp: 98 F (36.7  C)  Resp: 20  Weight: 131 lb (59.421 kg)     Physical Exam  Constitutional: He is well-developed, well-nourished, and in no distress.  HENT:  Head: Normocephalic and atraumatic.  Eyes: Conjunctivae and EOM are normal. Pupils are equal, round, and reactive to light.  Neck: Normal range of motion. Neck supple.  Cardiovascular: Normal rate, regular rhythm and normal heart sounds.   Pulmonary/Chest: Effort normal and breath sounds normal.  Abdominal: Soft. Bowel sounds are normal.  Musculoskeletal: He exhibits no edema and no tenderness.  Brace to right lower extremity right sided hemiparesis  Points to right toe- staff reports increased complaints of right foot and will not participate in therapy at times   Neurological: He is alert.  Skin: Skin is warm and dry.  Psychiatric: Affect normal.    Labs reviewed: 08-08-11: tsh 0.619; FTI 3.5; thyroglobulin 3.0; thyroid antibodies <20.0; tpo <10.0; free t3 2.9;  free t4: 1.26  09-10-12: wbc 6.4; hgb 10.8; hct 30.9; mcv 57.8; plt 208; glucose 80; bun 16; creat 1.01; k+4.6; na++139; liver normal albumin 3.9  03-25-13: glucose 132; bun 14; creat 1.03; k+3.8; na++ 137; liver normal albumin 4.4 PSA 0.89  04-26-13: wbc 5.8; hgb 10.6; hct 31.5 ;mcv 89.2 ;plt 220  Iron and  IBC  Result: 07/24/2013 11:21 AM ( Status: F ) C  Iron 61 42-165 ug/dL SLN  UIBC 202 125-400 ug/dL SLN  TIBC 263 215-435 ug/dL SLN  %SAT 23 20-55 % SLN  CBC NO Diff (Complete Blood Count)  Result: 07/24/2013 10:22 AM ( Status: F )  WBC 6.0 4.0-10.5 K/uL SLN  RBC 5.28 4.22-5.81 MIL/uL SLN  Hemoglobin 10.5 L 13.0-17.0 g/dL SLN  Hematocrit 31.2 L 39.0-52.0 % SLN  MCV 59.1 L 78.0-100.0 fL SLN  MCH 19.9 L 26.0-34.0 pg SLN  MCHC 33.7 30.0-36.0 g/dL SLN  RDW 16.9 H 11.5-15.5 % SLN  Platelet Count 215 150-400 K/uL SLN  Comprehensive Metabolic Panel  Result: 0/07/7739 11:21 AM ( Status: F )  Sodium 141 135-145 mEq/L SLN  Potassium 4.2 3.5-5.3 mEq/L SLN  Chloride 105 96-112 mEq/L  SLN  CO2 27 19-32 mEq/L SLN  Glucose 77 70-99 mg/dL SLN  BUN 18 6-23 mg/dL SLN  Creatinine 0.99 0.50-1.35 mg/dL SLN  Bilirubin, Total 0.5 0.2-1.2 mg/dL SLN C  Alkaline Phosphatase 77 39-117 U/L SLN  AST/SGOT 14 0-37 U/L SLN  ALT/SGPT 10 0-53 U/L SLN  Total Protein 6.7 6.0-8.3 g/dL SLN  Albumin 3.7 3.5-5.2 g/dL SLN  Calcium 9.2 8.4-10.5 mg/dL SLN  TSH, Ultrasensitive  Result: 07/24/2013 2:12 PM ( Status: F )  TSH 0.681 0.350-4.500 uIU/mL SLN  Lipid Profile  Result: 07/03/2013 11:24 AM ( Status: F ) C  Cholesterol 171 0-200 mg/dL SLN C  Triglyceride 89 <150 mg/dL SLN  HDL Cholesterol 38 L >39 mg/dL SLN  Total Chol/HDL Ratio 4.5 Ratio SLN  VLDL Cholesterol (Calc) 18 0-40 mg/dL SLN  LDL Cholesterol (Calc) 115 H 0-99 mg/dL SLN C  Vitamin D (25-Hydroxy)  Result: 07/03/2013 1:09 PM ( Status: F )  Vitamin D (25-Hydroxy) 46 30-89 ng/mL SLN CBC with Diff    Result: 11/20/2013 10:41 AM   ( Status: F )     C WBC 7.6     4.0-10.5 K/uL SLN   RBC 5.72     4.22-5.81 MIL/uL SLN   Hemoglobin 11.5   L 13.0-17.0 g/dL SLN   Hematocrit 33.1   L 39.0-52.0 % SLN   MCV 57.9   L 78.0-100.0 fL SLN   MCH 20.1   L 26.0-34.0 pg SLN   MCHC 34.7     30.0-36.0 g/dL SLN   RDW 17.1   H 11.5-15.5 % SLN   Platelet Count 245     150-400 K/uL SLN   Granulocyte % 65     43-77 % SLN   Absolute Gran 4.9     1.7-7.7 K/uL SLN   Lymph % 21     12-46 % SLN   Absolute Lymph 1.6     0.7-4.0 K/uL SLN   Mono % 9     3-12 % SLN   Absolute Mono 0.7     0.1-1.0 K/uL SLN   Eos % 5     0-5 % SLN   Absolute Eos 0.4     0.0-0.7 K/uL SLN   Baso % 0     0-1 % SLN   Absolute Baso 0.0     0.0-0.1 K/uL SLN   Smear Review Criteria for review not met  SLN   Comprehensive Metabolic Panel    Result: 11/20/2013 10:52 AM   ( Status: F )       Sodium 140     135-145 mEq/L SLN   Potassium 4.3     3.5-5.3 mEq/L SLN  Chloride 104     96-112 mEq/L SLN   CO2 26     19-32 mEq/L SLN   Glucose 71     70-99 mg/dL SLN   BUN 17     6-23 mg/dL SLN    Creatinine 0.96     0.50-1.35 mg/dL SLN   Bilirubin, Total 0.8     0.2-1.2 mg/dL SLN   Alkaline Phosphatase 62     39-117 U/L SLN   AST/SGOT 13     0-37 U/L SLN   ALT/SGPT 15     0-53 U/L SLN   Total Protein 7.3     6.0-8.3 g/dL SLN   Albumin 4.1     3.5-5.2 g/dL SLN   Calcium 9.2     8.4-10.5 mg/dL SLN   Lipid Profile    Result: 11/20/2013 10:52 AM   ( Status: F )       Cholesterol 175     0-200 mg/dL SLN C Triglyceride 137     <150 mg/dL SLN   HDL Cholesterol 45     >39 mg/dL SLN   Total Chol/HDL Ratio 3.9      Ratio SLN   VLDL Cholesterol (Calc) 27     0-40 mg/dL SLN   LDL Cholesterol (Calc) 103   H 0-99 mg/dL SLN    Assessment/Plan 1. Essential hypertension, benign -Patient is stable; continue current regimen. Will monitor and make changes as necessary.  2. Hemiplegia affecting dominant side, late effect of cerebrovascular disease -will increase baclofen to see if this improves pain pt unable to describe pain due to aphagia, baclofen 10 mg TID PO  3. DYSPHAGIA -precautions in place, no signs of aspiration at this time  4. Gastroesophageal reflux disease without esophagitis Weight is stable, without any signs or symptoms of reflux a this time -off all medications

## 2014-02-10 ENCOUNTER — Encounter: Payer: Self-pay | Admitting: Gastroenterology

## 2014-03-17 ENCOUNTER — Non-Acute Institutional Stay (SKILLED_NURSING_FACILITY): Payer: Medicare Other | Admitting: Nurse Practitioner

## 2014-03-17 DIAGNOSIS — I10 Essential (primary) hypertension: Secondary | ICD-10-CM

## 2014-03-17 DIAGNOSIS — I69959 Hemiplegia and hemiparesis following unspecified cerebrovascular disease affecting unspecified side: Secondary | ICD-10-CM

## 2014-03-17 NOTE — Progress Notes (Signed)
Patient ID: Philip [name], male   DOB: 13-May-1951, 63 y.o.   MRN: [id number]   Nursing Home Location:  [place] of Service: SNF (31)  PCP: Cyndee Brightly, MD  No Known Allergies  Chief Complaint  Patient presents with  . 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 Staff reports pt is doing well. Does not have signs of pain with increase in baclofen.   Review of Systems:  Unable to provide ROS due to aphagia    Past Medical History  Diagnosis Date  . CVA (cerebral vascular accident)   . Hemiplegia affecting dominant side, late effect of cerebrovascular disease   . Vitamin D deficiency   . Essential hypertension, benign   . GERD (gastroesophageal reflux disease)   . Anemia   . Thyroid disease   . Pneumonia   . DYSPHAGIA 07/09/2008    Qualifier: Diagnosis of  By: Ardis Hughs MD, Melene Plan    No past surgical history on file. Social History:   has no tobacco, alcohol, and drug history on file.  No family history on file.  Medications: Patient's Medications  New Prescriptions   No medications on file  Previous Medications   ACETAMINOPHEN (TYLENOL) 500 MG TABLET    Take 1,000 mg by mouth 2 (two) times daily.   BACLOFEN (LIORESAL) 10 MG TABLET    Take 10 mg by mouth 3 (three) times daily.    CHOLECALCIFEROL (VITAMIN D) 1000 UNITS TABLET    Take 2,000 Units by mouth daily.   LABETALOL (NORMODYNE) 100 MG TABLET    Take 100 mg by mouth daily.   RAMIPRIL (ALTACE) 5 MG CAPSULE    Take 5 mg by mouth daily.  Modified Medications   No medications on file  Discontinued Medications   No medications on file     Physical Exam:  Filed Vitals:   03/17/14 1654  BP: 114/70  Pulse: 76  Temp: 96.8 F (36 C)  Resp: 20  Weight: 130  lb (58.968 kg)     Physical Exam  Constitutional: He is well-developed, well-nourished, and in no distress.  HENT:  Head: Normocephalic and atraumatic.  Eyes: Conjunctivae and EOM are normal. Pupils are equal, round, and reactive to light.  Neck: Normal range of motion. Neck supple.  Cardiovascular: Normal rate, regular rhythm and normal heart sounds.   Pulmonary/Chest: Effort normal and breath sounds normal.  Abdominal: Soft. Bowel sounds are normal.  Musculoskeletal: He exhibits no edema and no tenderness.  Brace to right lower extremity right sided hemiparesis     Neurological: He is alert.  Skin: Skin is warm and dry.  Psychiatric: Affect normal.    Labs reviewed: 08-08-11: tsh 0.619; FTI 3.5; thyroglobulin 3.0; thyroid antibodies <20.0; tpo <10.0; free t3 2.9;  free t4: 1.26  09-10-12: wbc 6.4; hgb 10.8; hct 30.9; mcv 57.8; plt 208; glucose 80; bun 16; creat 1.01; k+4.6; na++139; liver normal albumin 3.9  03-25-13: glucose 132; bun 14; creat 1.03; k+3.8; na++ 137; liver normal albumin 4.4 PSA 0.89  04-26-13: wbc 5.8; hgb 10.6; hct 31.5 ;mcv 89.2 ;plt 220  Iron and IBC  Result: 07/24/2013 11:21 AM ( Status: F ) C  Iron 61 42-165 ug/dL SLN  UIBC 202 125-400  ug/dL SLN  TIBC 263 215-435 ug/dL SLN  %SAT 23 20-55 % SLN  CBC NO Diff (Complete Blood Count)  Result: 07/24/2013 10:22 AM ( Status: F )  WBC 6.0 4.0-10.5 K/uL SLN  RBC 5.28 4.22-5.81 MIL/uL SLN  Hemoglobin 10.5 L 13.0-17.0 g/dL SLN  Hematocrit 31.2 L 39.0-52.0 % SLN  MCV 59.1 L 78.0-100.0 fL SLN  MCH 19.9 L 26.0-34.0 pg SLN  MCHC 33.7 30.0-36.0 g/dL SLN  RDW 16.9 H 11.5-15.5 % SLN  Platelet Count 215 150-400 K/uL SLN  Comprehensive Metabolic Panel  Result: 01/24/7671 11:21 AM ( Status: F )  Sodium 141 135-145 mEq/L SLN  Potassium 4.2 3.5-5.3 mEq/L SLN  Chloride 105 96-112 mEq/L SLN  CO2 27 19-32 mEq/L SLN  Glucose 77 70-99 mg/dL SLN  BUN 18 6-23 mg/dL SLN  Creatinine 0.99 0.50-1.35 mg/dL SLN  Bilirubin, Total 0.5  0.2-1.2 mg/dL SLN C  Alkaline Phosphatase 77 39-117 U/L SLN  AST/SGOT 14 0-37 U/L SLN  ALT/SGPT 10 0-53 U/L SLN  Total Protein 6.7 6.0-8.3 g/dL SLN  Albumin 3.7 3.5-5.2 g/dL SLN  Calcium 9.2 8.4-10.5 mg/dL SLN  TSH, Ultrasensitive  Result: 07/24/2013 2:12 PM ( Status: F )  TSH 0.681 0.350-4.500 uIU/mL SLN  Lipid Profile  Result: 07/03/2013 11:24 AM ( Status: F ) C  Cholesterol 171 0-200 mg/dL SLN C  Triglyceride 89 <150 mg/dL SLN  HDL Cholesterol 38 L >39 mg/dL SLN  Total Chol/HDL Ratio 4.5 Ratio SLN  VLDL Cholesterol (Calc) 18 0-40 mg/dL SLN  LDL Cholesterol (Calc) 115 H 0-99 mg/dL SLN C  Vitamin D (25-Hydroxy)  Result: 07/03/2013 1:09 PM ( Status: F )  Vitamin D (25-Hydroxy) 46 30-89 ng/mL SLN CBC with Diff    Result: 11/20/2013 10:41 AM   ( Status: F )     C WBC 7.6     4.0-10.5 K/uL SLN   RBC 5.72     4.22-5.81 MIL/uL SLN   Hemoglobin 11.5   L 13.0-17.0 g/dL SLN   Hematocrit 33.1   L 39.0-52.0 % SLN   MCV 57.9   L 78.0-100.0 fL SLN   MCH 20.1   L 26.0-34.0 pg SLN   MCHC 34.7     30.0-36.0 g/dL SLN   RDW 17.1   H 11.5-15.5 % SLN   Platelet Count 245     150-400 K/uL SLN   Granulocyte % 65     43-77 % SLN   Absolute Gran 4.9     1.7-7.7 K/uL SLN   Lymph % 21     12-46 % SLN   Absolute Lymph 1.6     0.7-4.0 K/uL SLN   Mono % 9     3-12 % SLN   Absolute Mono 0.7     0.1-1.0 K/uL SLN   Eos % 5     0-5 % SLN   Absolute Eos 0.4     0.0-0.7 K/uL SLN   Baso % 0     0-1 % SLN   Absolute Baso 0.0     0.0-0.1 K/uL SLN   Smear Review Criteria for review not met  SLN   Comprehensive Metabolic Panel    Result: 11/20/2013 10:52 AM   ( Status: F )       Sodium 140     135-145 mEq/L SLN   Potassium 4.3     3.5-5.3 mEq/L SLN   Chloride 104     96-112 mEq/L SLN   CO2 26     19-32 mEq/L  SLN   Glucose 71     70-99 mg/dL SLN   BUN 17     6-23 mg/dL SLN   Creatinine 0.96     0.50-1.35 mg/dL SLN   Bilirubin, Total 0.8     0.2-1.2 mg/dL SLN   Alkaline Phosphatase 62     39-117 U/L SLN     AST/SGOT 13     0-37 U/L SLN   ALT/SGPT 15     0-53 U/L SLN   Total Protein 7.3     6.0-8.3 g/dL SLN   Albumin 4.1     3.5-5.2 g/dL SLN   Calcium 9.2     8.4-10.5 mg/dL SLN   Lipid Profile    Result: 11/20/2013 10:52 AM   ( Status: F )       Cholesterol 175     0-200 mg/dL SLN C Triglyceride 137     <150 mg/dL SLN   HDL Cholesterol 45     >39 mg/dL SLN   Total Chol/HDL Ratio 3.9      Ratio SLN   VLDL Cholesterol (Calc) 27     0-40 mg/dL SLN   LDL Cholesterol (Calc) 103   H 0-99 mg/dL SLN   Liver Profile    Result: 12/27/2013 12:08 PM   ( Status: F )       Bilirubin, Total 0.8     0.2-1.2 mg/dL SLN   Bilirubin, Direct 0.1     0.0-0.3 mg/dL SLN   Indirect Bilirubin 0.7     0.2-1.2 mg/dL SLN   Alkaline Phosphatase 62     39-117 U/L SLN   AST/SGOT 13     0-37 U/L SLN   ALT/SGPT 15     0-53 U/L SLN   Total Protein 7.2     6.0-8.3 g/dL SLN   Albumin 4.0   Assessment/Plan 1. Essential hypertension, benign -Patients blood pressure is stable; continue current regimen. Will monitor and make changes as necessary.  2. Hemiplegia affecting dominant side, late effect of cerebrovascular disease -has done well with increase in bactrim, without complaints   3. DYSPHAGIA -precautions in place, no signs of aspiration at this time  4. Gastroesophageal reflux disease without esophagitis Weight is stable, without any signs or symptoms of reflux a this time -off all medications  Health maintenance- pt being scheduled for dentist and screening colonoscopy

## 2014-03-25 ENCOUNTER — Encounter: Payer: Self-pay | Admitting: *Deleted

## 2014-03-25 ENCOUNTER — Telehealth: Payer: Self-pay | Admitting: *Deleted

## 2014-03-25 NOTE — Telephone Encounter (Signed)
See other TE documentation.

## 2014-03-25 NOTE — Progress Notes (Unsigned)
Patient ID: Philip [name], male   DOB: [birth year], 63 y.o.   MRN: [id number] [facility]. Pt has an extensive medical history to include CVA, peg tube placement ( not sure if still in as per aide states he eats by mouth and she did not know if he had a tube) , right side hemiplegia. This pt was scheduled as a direct colon and needs an office visit with an extender or doctor. He also needs his colon to be done in the hospital with his medical issues. The aide states he can briefly stand she thinks but not sure if he could dress his self. He also has aphagia and was not able to answer any questions. Due to this extensive medical history, his PV and colon was cancelled until he can get in to the office.  Bufford SpikesMarie McCraw RN  Pre visit

## 2014-03-27 ENCOUNTER — Telehealth: Payer: Self-pay | Admitting: *Deleted

## 2014-03-27 NOTE — Telephone Encounter (Signed)
Thank you Dr. Christella HartiganJacobs I will pass phone message [facility]

## 2014-03-27 NOTE — Telephone Encounter (Signed)
I would not directly schedule her for colonoscopy without office visit first.  Agree with office visit to discuss colon cancer screening options: perhaps colonoscopy, perhaps no screening, perhaps colo guard?

## 2014-03-27 NOTE — Telephone Encounter (Signed)
Dr. Christella HartiganJacobs,  Can you please review this patient's chart. The patient saw you in 2010 for PEG removal. Patient was put on with Shanda BumpsJessica to discuss direct colon at hospital. Patient has had a CVA and other health issues. Can you please advise if it is okay for patient to be scheduled for Colonoscopy?

## 2014-03-28 ENCOUNTER — Encounter: Payer: Self-pay | Admitting: *Deleted

## 2014-04-02 ENCOUNTER — Telehealth: Payer: Self-pay | Admitting: *Deleted

## 2014-04-02 ENCOUNTER — Encounter: Payer: Self-pay | Admitting: Gastroenterology

## 2014-04-02 ENCOUNTER — Encounter: Payer: Medicare Other | Admitting: Gastroenterology

## 2014-04-02 NOTE — Telephone Encounter (Signed)
I called [facility] and spoke to Puhihantel. I asked if this patient needed a medical power of attorney. She said he can make his own decisions.  He has trouble speaking due to a stroke he had in the past. He does need a vietnamese interpreter.

## 2014-04-03 NOTE — Progress Notes (Signed)
Erroneous encounter

## 2014-04-07 ENCOUNTER — Non-Acute Institutional Stay (SKILLED_NURSING_FACILITY): Payer: Medicare Other | Admitting: Nurse Practitioner

## 2014-04-07 ENCOUNTER — Encounter: Payer: Self-pay | Admitting: Gastroenterology

## 2014-04-07 DIAGNOSIS — I10 Essential (primary) hypertension: Secondary | ICD-10-CM

## 2014-04-07 DIAGNOSIS — G8190 Hemiplegia, unspecified affecting unspecified side: Secondary | ICD-10-CM

## 2014-04-07 DIAGNOSIS — E559 Vitamin D deficiency, unspecified: Secondary | ICD-10-CM

## 2014-04-07 DIAGNOSIS — R1314 Dysphagia, pharyngoesophageal phase: Secondary | ICD-10-CM

## 2014-04-07 DIAGNOSIS — I69959 Hemiplegia and hemiparesis following unspecified cerebrovascular disease affecting unspecified side: Secondary | ICD-10-CM

## 2014-04-07 NOTE — Progress Notes (Signed)
Patient ID: Philip [name], male   DOB: [birth year], 63 y.o.   MRN: [id number]   Nursing Home Location:  [place] of Service: SNF (31)  PCP: Cyndee Brightly, MD  No Known Allergies  Chief Complaint  Patient presents with  . Medical Management of Chronic Issues    HPI:  63 year old male with a pmh of left basal ganglial intracerebral hemorrhage in 2009 now with aphagia, dysphagia and right sided hemipareisis, anemia, vit D def, htn  who is being seen today for routine follow up on chronic conditions. There has been no acute concerns or visits in the last month. Pt is at baseline at this time. Eating well, no changes in activity or bowels. Pain is well controlled on current medications.  Review of Systems:  Unable to provide ROS due to aphagia    Past Medical History  Diagnosis Date  . CVA (cerebral vascular accident)   . Hemiplegia affecting dominant side, late effect of cerebrovascular disease   . Vitamin D deficiency   . Essential hypertension, benign   . GERD (gastroesophageal reflux disease)   . Anemia   . Thyroid disease   . Pneumonia   . DYSPHAGIA 07/09/2008    Qualifier: Diagnosis of  By: Ardis Hughs MD, Melene Plan   . Intracerebral hemorrhage   . Risk for falls    Past Surgical History  Procedure Laterality Date  . Peg placement     Social History:   has no tobacco, alcohol, and drug history on file.  No family history on file.  Medications: Patient's Medications  New Prescriptions   No medications on file  Previous Medications   ACETAMINOPHEN (TYLENOL) 500 MG TABLET    Take 1,000 mg by mouth 2 (two) times daily.   BACLOFEN (LIORESAL) 10 MG TABLET    Take 10 mg by mouth 3 (three) times daily.    BENAZEPRIL (LOTENSIN) 10 MG TABLET    Take 10 mg by mouth daily.   CHOLECALCIFEROL (VITAMIN D) 1000 UNITS TABLET    Take 2,000 Units by mouth daily.   LABETALOL (NORMODYNE) 100 MG TABLET    Take 100 mg by mouth daily.  Modified Medications   No  medications on file  Discontinued Medications   No medications on file     Physical Exam:  Filed Vitals:   04/07/14 2035  BP: 132/68  Pulse: 73  Temp: 98.4 F (36.9 C)  Resp: 18  Weight: 131 lb (59.421 kg)     Physical Exam  Constitutional: He is well-developed, well-nourished, and in no distress.  HENT:  Head: Normocephalic and atraumatic.  Mouth/Throat: Oropharynx is clear and moist. No oropharyngeal exudate.  Eyes: Conjunctivae and EOM are normal. Pupils are equal, round, and reactive to light.  Neck: Normal range of motion. Neck supple.  Cardiovascular: Normal rate, regular rhythm and normal heart sounds.   Pulmonary/Chest: Effort normal and breath sounds normal.  Abdominal: Soft. Bowel sounds are normal.  Musculoskeletal: He exhibits no edema and no tenderness.  Brace to right lower extremity right sided hemiparesis   Neurological: He is alert.  Skin: Skin is warm and dry.  Psychiatric: Affect normal.    Labs reviewed: 08-08-11: tsh 0.619; FTI 3.5; thyroglobulin 3.0; thyroid antibodies <20.0; tpo <10.0; free t3 2.9;  free t4: 1.26  09-10-12: wbc 6.4; hgb 10.8; hct 30.9; mcv 57.8; plt 208; glucose 80; bun 16; creat 1.01; k+4.6; na++139; liver normal albumin 3.9  03-25-13: glucose 132; bun 14; creat  1.03; k+3.8; na++ 137; liver normal albumin 4.4 PSA 0.89  04-26-13: wbc 5.8; hgb 10.6; hct 31.5 ;mcv 89.2 ;plt 220  Iron and IBC  Result: 07/24/2013 11:21 AM ( Status: F ) C  Iron 61 42-165 ug/dL SLN  UIBC 202 125-400 ug/dL SLN  TIBC 263 215-435 ug/dL SLN  %SAT 23 20-55 % SLN  CBC NO Diff (Complete Blood Count)  Result: 07/24/2013 10:22 AM ( Status: F )  WBC 6.0 4.0-10.5 K/uL SLN  RBC 5.28 4.22-5.81 MIL/uL SLN  Hemoglobin 10.5 L 13.0-17.0 g/dL SLN  Hematocrit 31.2 L 39.0-52.0 % SLN  MCV 59.1 L 78.0-100.0 fL SLN  MCH 19.9 L 26.0-34.0 pg SLN  MCHC 33.7 30.0-36.0 g/dL SLN  RDW 16.9 H 11.5-15.5 % SLN  Platelet Count 215 150-400 K/uL SLN  Comprehensive Metabolic Panel    Result: 07/24/2013 11:21 AM ( Status: F )  Sodium 141 135-145 mEq/L SLN  Potassium 4.2 3.5-5.3 mEq/L SLN  Chloride 105 96-112 mEq/L SLN  CO2 27 19-32 mEq/L SLN  Glucose 77 70-99 mg/dL SLN  BUN 18 6-23 mg/dL SLN  Creatinine 0.99 0.50-1.35 mg/dL SLN  Bilirubin, Total 0.5 0.2-1.2 mg/dL SLN C  Alkaline Phosphatase 77 39-117 U/L SLN  AST/SGOT 14 0-37 U/L SLN  ALT/SGPT 10 0-53 U/L SLN  Total Protein 6.7 6.0-8.3 g/dL SLN  Albumin 3.7 3.5-5.2 g/dL SLN  Calcium 9.2 8.4-10.5 mg/dL SLN  TSH, Ultrasensitive  Result: 07/24/2013 2:12 PM ( Status: F )  TSH 0.681 0.350-4.500 uIU/mL SLN  Lipid Profile  Result: 07/03/2013 11:24 AM ( Status: F ) C  Cholesterol 171 0-200 mg/dL SLN C  Triglyceride 89 <150 mg/dL SLN  HDL Cholesterol 38 L >39 mg/dL SLN  Total Chol/HDL Ratio 4.5 Ratio SLN  VLDL Cholesterol (Calc) 18 0-40 mg/dL SLN  LDL Cholesterol (Calc) 115 H 0-99 mg/dL SLN C  Vitamin D (25-Hydroxy)  Result: 07/03/2013 1:09 PM ( Status: F )  Vitamin D (25-Hydroxy) 46 30-89 ng/mL SLN CBC with Diff    Result: 11/20/2013 10:41 AM   ( Status: F )     C WBC 7.6     4.0-10.5 K/uL SLN   RBC 5.72     4.22-5.81 MIL/uL SLN   Hemoglobin 11.5   L 13.0-17.0 g/dL SLN   Hematocrit 33.1   L 39.0-52.0 % SLN   MCV 57.9   L 78.0-100.0 fL SLN   MCH 20.1   L 26.0-34.0 pg SLN   MCHC 34.7     30.0-36.0 g/dL SLN   RDW 17.1   H 11.5-15.5 % SLN   Platelet Count 245     150-400 K/uL SLN   Granulocyte % 65     43-77 % SLN   Absolute Gran 4.9     1.7-7.7 K/uL SLN   Lymph % 21     12-46 % SLN   Absolute Lymph 1.6     0.7-4.0 K/uL SLN   Mono % 9     3-12 % SLN   Absolute Mono 0.7     0.1-1.0 K/uL SLN   Eos % 5     0-5 % SLN   Absolute Eos 0.4     0.0-0.7 K/uL SLN   Baso % 0     0-1 % SLN   Absolute Baso 0.0     0.0-0.1 K/uL SLN   Smear Review Criteria for review not met  SLN   Comprehensive Metabolic Panel    Result: 11/20/2013 10:52 AM   ( Status: F )  Sodium 140     135-145 mEq/L SLN   Potassium 4.3      3.5-5.3 mEq/L SLN   Chloride 104     96-112 mEq/L SLN   CO2 26     19-32 mEq/L SLN   Glucose 71     70-99 mg/dL SLN   BUN 17     6-23 mg/dL SLN   Creatinine 0.96     0.50-1.35 mg/dL SLN   Bilirubin, Total 0.8     0.2-1.2 mg/dL SLN   Alkaline Phosphatase 62     39-117 U/L SLN   AST/SGOT 13     0-37 U/L SLN   ALT/SGPT 15     0-53 U/L SLN   Total Protein 7.3     6.0-8.3 g/dL SLN   Albumin 4.1     3.5-5.2 g/dL SLN   Calcium 9.2     8.4-10.5 mg/dL SLN   Lipid Profile    Result: 11/20/2013 10:52 AM   ( Status: F )       Cholesterol 175     0-200 mg/dL SLN C Triglyceride 137     <150 mg/dL SLN   HDL Cholesterol 45     >39 mg/dL SLN   Total Chol/HDL Ratio 3.9      Ratio SLN   VLDL Cholesterol (Calc) 27     0-40 mg/dL SLN   LDL Cholesterol (Calc) 103   H 0-99 mg/dL SLN   Liver Profile    Result: 12/27/2013 12:08 PM   ( Status: F )       Bilirubin, Total 0.8     0.2-1.2 mg/dL SLN   Bilirubin, Direct 0.1     0.0-0.3 mg/dL SLN   Indirect Bilirubin 0.7     0.2-1.2 mg/dL SLN   Alkaline Phosphatase 62     39-117 U/L SLN   AST/SGOT 13     0-37 U/L SLN   ALT/SGPT 15     0-53 U/L SLN   Total Protein 7.2     6.0-8.3 g/dL SLN   Albumin 4.0   Assessment/Plan  1. Vitamin D deficiency conts on supplement  2. 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 Essential hypertension, benign -remains on Lotensin and labetalol, blood pressure well controlled.  4. Dysphagia, pharyngoesophageal phase -without signs of aspiration, tolerating diet at this time

## 2014-04-08 ENCOUNTER — Encounter: Payer: Medicare Other | Admitting: Gastroenterology

## 2014-04-16 ENCOUNTER — Ambulatory Visit: Payer: Medicare Other | Admitting: Gastroenterology

## 2014-04-18 ENCOUNTER — Ambulatory Visit: Payer: Medicare Other | Admitting: Physician Assistant

## 2014-05-05 ENCOUNTER — Non-Acute Institutional Stay (SKILLED_NURSING_FACILITY): Payer: Medicare Other | Admitting: Nurse Practitioner

## 2014-05-05 DIAGNOSIS — G8190 Hemiplegia, unspecified affecting unspecified side: Secondary | ICD-10-CM

## 2014-05-05 DIAGNOSIS — E559 Vitamin D deficiency, unspecified: Secondary | ICD-10-CM

## 2014-05-05 DIAGNOSIS — I69959 Hemiplegia and hemiparesis following unspecified cerebrovascular disease affecting unspecified side: Secondary | ICD-10-CM

## 2014-05-05 DIAGNOSIS — R1314 Dysphagia, pharyngoesophageal phase: Secondary | ICD-10-CM

## 2014-05-05 DIAGNOSIS — I10 Essential (primary) hypertension: Secondary | ICD-10-CM

## 2014-05-05 NOTE — Progress Notes (Signed)
Patient ID: Philip [name], male   DOB: Aug 08, 1950, 63 y.o.   MRN: [id number]   Nursing Home Location:  [place] of Service: SNF (31)  PCP: Cyndee Brightly, MD  No Known Allergies  Chief Complaint  Patient presents with  . Medical Management of Chronic Issues    HPI:  63 year old male with a pmh of left basal ganglial intracerebral hemorrhage in 2009 now with aphagia, dysphagia and right sided hemipareisis, anemia, vit D def, htn  who is being seen today for routine follow up on chronic conditions. There has been no changes in the last month. Pt doing well with current therapies. Nursing without any concerns. Pt unable to contribute to ROS or HPI due to aphagia secondary to intracerebral hemorrhage   Review of Systems:  Unable to provide ROS due to aphagia    Past Medical History  Diagnosis Date  . CVA (cerebral vascular accident)   . Hemiplegia affecting dominant side, late effect of cerebrovascular disease   . Vitamin D deficiency   . Essential hypertension, benign   . GERD (gastroesophageal reflux disease)   . Anemia   . Thyroid disease   . Pneumonia   . DYSPHAGIA 07/09/2008    Qualifier: Diagnosis of  By: Ardis Hughs MD, Melene Plan   . Intracerebral hemorrhage   . Risk for falls    Past Surgical History  Procedure Laterality Date  . Peg placement     Social History:   has no tobacco, alcohol, and drug history on file.  No family history on file.  Medications: Patient's Medications  New Prescriptions   No medications on file  Previous Medications   ACETAMINOPHEN (TYLENOL) 500 MG TABLET    Take 1,000 mg by mouth 2 (two) times daily.   BACLOFEN (LIORESAL) 10 MG TABLET    Take 10 mg by mouth 3 (three) times daily.    BENAZEPRIL (LOTENSIN) 10 MG TABLET    Take 10 mg by mouth daily.   CHOLECALCIFEROL (VITAMIN D) 1000 UNITS TABLET    Take 2,000 Units by mouth daily.   LABETALOL (NORMODYNE) 100 MG TABLET    Take 100 mg by mouth daily.  Modified  Medications   No medications on file  Discontinued Medications   No medications on file     Physical Exam:  Filed Vitals:   05/05/14 1237  BP: 125/76  Pulse: 72  Temp: 98.1 F (36.7 C)  Resp: 18     Physical Exam  Constitutional: He is well-developed, well-nourished, and in no distress.  HENT:  Head: Normocephalic and atraumatic.  Mouth/Throat: Oropharynx is clear and moist. No oropharyngeal exudate.  Eyes: Conjunctivae and EOM are normal. Pupils are equal, round, and reactive to light.  Neck: Normal range of motion. Neck supple.  Cardiovascular: Normal rate, regular rhythm and normal heart sounds.   Pulmonary/Chest: Effort normal and breath sounds normal.  Abdominal: Soft. Bowel sounds are normal.  Musculoskeletal: He exhibits no edema or tenderness.  Brace to right wrist and lower extremity right sided hemiparesis   Neurological: He is alert.  Skin: Skin is warm and dry.  Psychiatric: Affect normal.    Labs reviewed: 08-08-11: tsh 0.619; FTI 3.5; thyroglobulin 3.0; thyroid antibodies <20.0; tpo <10.0; free t3 2.9;  free t4: 1.26  09-10-12: wbc 6.4; hgb 10.8; hct 30.9; mcv 57.8; plt 208; glucose 80; bun 16; creat 1.01; k+4.6; na++139; liver normal albumin 3.9  03-25-13: glucose 132; bun 14; creat 1.03; k+3.8; na++ 137;  liver normal albumin 4.4 PSA 0.89  04-26-13: wbc 5.8; hgb 10.6; hct 31.5 ;mcv 89.2 ;plt 220  Iron and IBC  Result: 07/24/2013 11:21 AM ( Status: F ) C  Iron 61 42-165 ug/dL SLN  UIBC 202 125-400 ug/dL SLN  TIBC 263 215-435 ug/dL SLN  %SAT 23 20-55 % SLN  CBC NO Diff (Complete Blood Count)  Result: 07/24/2013 10:22 AM ( Status: F )  WBC 6.0 4.0-10.5 K/uL SLN  RBC 5.28 4.22-5.81 MIL/uL SLN  Hemoglobin 10.5 L 13.0-17.0 g/dL SLN  Hematocrit 31.2 L 39.0-52.0 % SLN  MCV 59.1 L 78.0-100.0 fL SLN  MCH 19.9 L 26.0-34.0 pg SLN  MCHC 33.7 30.0-36.0 g/dL SLN  RDW 16.9 H 11.5-15.5 % SLN  Platelet Count 215 150-400 K/uL SLN  Comprehensive Metabolic Panel  Result:  4/0/9811 11:21 AM ( Status: F )  Sodium 141 135-145 mEq/L SLN  Potassium 4.2 3.5-5.3 mEq/L SLN  Chloride 105 96-112 mEq/L SLN  CO2 27 19-32 mEq/L SLN  Glucose 77 70-99 mg/dL SLN  BUN 18 6-23 mg/dL SLN  Creatinine 0.99 0.50-1.35 mg/dL SLN  Bilirubin, Total 0.5 0.2-1.2 mg/dL SLN C  Alkaline Phosphatase 77 39-117 U/L SLN  AST/SGOT 14 0-37 U/L SLN  ALT/SGPT 10 0-53 U/L SLN  Total Protein 6.7 6.0-8.3 g/dL SLN  Albumin 3.7 3.5-5.2 g/dL SLN  Calcium 9.2 8.4-10.5 mg/dL SLN  TSH, Ultrasensitive  Result: 07/24/2013 2:12 PM ( Status: F )  TSH 0.681 0.350-4.500 uIU/mL SLN  Lipid Profile  Result: 07/03/2013 11:24 AM ( Status: F ) C  Cholesterol 171 0-200 mg/dL SLN C  Triglyceride 89 <150 mg/dL SLN  HDL Cholesterol 38 L >39 mg/dL SLN  Total Chol/HDL Ratio 4.5 Ratio SLN  VLDL Cholesterol (Calc) 18 0-40 mg/dL SLN  LDL Cholesterol (Calc) 115 H 0-99 mg/dL SLN C  Vitamin D (25-Hydroxy)  Result: 07/03/2013 1:09 PM ( Status: F )  Vitamin D (25-Hydroxy) 46 30-89 ng/mL SLN CBC with Diff    Result: 11/20/2013 10:41 AM   ( Status: F )     C WBC 7.6     4.0-10.5 K/uL SLN   RBC 5.72     4.22-5.81 MIL/uL SLN   Hemoglobin 11.5   L 13.0-17.0 g/dL SLN   Hematocrit 33.1   L 39.0-52.0 % SLN   MCV 57.9   L 78.0-100.0 fL SLN   MCH 20.1   L 26.0-34.0 pg SLN   MCHC 34.7     30.0-36.0 g/dL SLN   RDW 17.1   H 11.5-15.5 % SLN   Platelet Count 245     150-400 K/uL SLN   Granulocyte % 65     43-77 % SLN   Absolute Gran 4.9     1.7-7.7 K/uL SLN   Lymph % 21     12-46 % SLN   Absolute Lymph 1.6     0.7-4.0 K/uL SLN   Mono % 9     3-12 % SLN   Absolute Mono 0.7     0.1-1.0 K/uL SLN   Eos % 5     0-5 % SLN   Absolute Eos 0.4     0.0-0.7 K/uL SLN   Baso % 0     0-1 % SLN   Absolute Baso 0.0     0.0-0.1 K/uL SLN   Smear Review Criteria for review not met  SLN   Comprehensive Metabolic Panel    Result: 11/20/2013 10:52 AM   ( Status: F )  Sodium 140     135-145 mEq/L SLN   Potassium 4.3     3.5-5.3 mEq/L SLN     Chloride 104     96-112 mEq/L SLN   CO2 26     19-32 mEq/L SLN   Glucose 71     70-99 mg/dL SLN   BUN 17     6-23 mg/dL SLN   Creatinine 0.96     0.50-1.35 mg/dL SLN   Bilirubin, Total 0.8     0.2-1.2 mg/dL SLN   Alkaline Phosphatase 62     39-117 U/L SLN   AST/SGOT 13     0-37 U/L SLN   ALT/SGPT 15     0-53 U/L SLN   Total Protein 7.3     6.0-8.3 g/dL SLN   Albumin 4.1     3.5-5.2 g/dL SLN   Calcium 9.2     8.4-10.5 mg/dL SLN   Lipid Profile    Result: 11/20/2013 10:52 AM   ( Status: F )       Cholesterol 175     0-200 mg/dL SLN C Triglyceride 137     <150 mg/dL SLN   HDL Cholesterol 45     >39 mg/dL SLN   Total Chol/HDL Ratio 3.9      Ratio SLN   VLDL Cholesterol (Calc) 27     0-40 mg/dL SLN   LDL Cholesterol (Calc) 103   H 0-99 mg/dL SLN   Liver Profile    Result: 12/27/2013 12:08 PM   ( Status: F )       Bilirubin, Total 0.8     0.2-1.2 mg/dL SLN   Bilirubin, Direct 0.1     0.0-0.3 mg/dL SLN   Indirect Bilirubin 0.7     0.2-1.2 mg/dL SLN   Alkaline Phosphatase 62     39-117 U/L SLN   AST/SGOT 13     0-37 U/L SLN   ALT/SGPT 15     0-53 U/L SLN   Total Protein 7.2     6.0-8.3 g/dL SLN   Albumin 4.0   Assessment/Plan 1. Dysphagia, pharyngoesophageal phase Does well on current diet, no signs of aspiration  2. Essential hypertension, benign Controlled on current regimen   3. 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    4. Vitamin D deficiency conts on supplement

## 2014-05-26 ENCOUNTER — Ambulatory Visit: Payer: Medicare Other | Admitting: Gastroenterology

## 2014-06-02 ENCOUNTER — Non-Acute Institutional Stay (SKILLED_NURSING_FACILITY): Payer: Medicare Other | Admitting: Nurse Practitioner

## 2014-06-02 DIAGNOSIS — I10 Essential (primary) hypertension: Secondary | ICD-10-CM

## 2014-06-02 DIAGNOSIS — I69959 Hemiplegia and hemiparesis following unspecified cerebrovascular disease affecting unspecified side: Secondary | ICD-10-CM

## 2014-06-02 DIAGNOSIS — G8190 Hemiplegia, unspecified affecting unspecified side: Secondary | ICD-10-CM

## 2014-06-02 DIAGNOSIS — R1314 Dysphagia, pharyngoesophageal phase: Secondary | ICD-10-CM

## 2014-06-02 NOTE — Progress Notes (Signed)
Patient ID: Philip [name], male   DOB: 12-11-1950, 63 y.o.   MRN: [id number]   Nursing Home Location:  [place] of Service: SNF (31)  PCP: Cyndee Brightly, MD  No Known Allergies  Chief Complaint  Patient presents with  . Medical Management of Chronic Issues    HPI:  63 year old male with a pmh of left basal ganglial intracerebral hemorrhage in 2009 now with aphagia, dysphagia and right sided hemipareisis, anemia, vit D def, htn  who is being seen today for routine follow up on chronic conditions. Pt with aphagia and unable to provide ROS or HPI. Staff reports patient has been at baseline in the last month. No changes in activities or behaviors noted.   Review of Systems:  Unable to provide ROS due to aphagia    Past Medical History  Diagnosis Date  . CVA (cerebral vascular accident)   . Hemiplegia affecting dominant side, late effect of cerebrovascular disease   . Vitamin D deficiency   . Essential hypertension, benign   . GERD (gastroesophageal reflux disease)   . Anemia   . Thyroid disease   . Pneumonia   . DYSPHAGIA 07/09/2008    Qualifier: Diagnosis of  By: Ardis Hughs MD, Melene Plan   . Intracerebral hemorrhage   . Risk for falls    Past Surgical History  Procedure Laterality Date  . Peg placement     Social History:   has no tobacco, alcohol, and drug history on file.  No family history on file.  Medications: Patient's Medications  New Prescriptions   No medications on file  Previous Medications   ACETAMINOPHEN (TYLENOL) 500 MG TABLET    Take 1,000 mg by mouth 2 (two) times daily.   BACLOFEN (LIORESAL) 10 MG TABLET    Take 10 mg by mouth 3 (three) times daily.    BENAZEPRIL (LOTENSIN) 10 MG TABLET    Take 10 mg by mouth daily.   CHOLECALCIFEROL (VITAMIN D) 1000 UNITS TABLET    Take 2,000 Units by mouth daily.   LABETALOL (NORMODYNE) 100 MG TABLET    Take 100 mg by mouth daily.  Modified Medications   No medications on file    Discontinued Medications   No medications on file     Physical Exam:  Filed Vitals:   06/02/14 1121  BP: 107/66  Pulse: 76  Temp: 98.3 F (36.8 C)  Resp: 16  Weight: 130 lb (58.968 kg)     Physical Exam  Constitutional: He is well-developed, well-nourished, and in no distress.  HENT:  Head: Normocephalic and atraumatic.  Mouth/Throat: Oropharynx is clear and moist. No oropharyngeal exudate.  Eyes: Conjunctivae and EOM are normal. Pupils are equal, round, and reactive to light.  Neck: Normal range of motion. Neck supple.  Cardiovascular: Normal rate, regular rhythm and normal heart sounds.   Pulmonary/Chest: Effort normal and breath sounds normal.  Abdominal: Soft. Bowel sounds are normal.  Musculoskeletal: He exhibits no edema or tenderness.  Brace to right wrist and lower extremity right sided hemiparesis   Neurological: He is alert.  Skin: Skin is warm and dry.  Psychiatric: Affect normal.    Labs reviewed: 08-08-11: tsh 0.619; FTI 3.5; thyroglobulin 3.0; thyroid antibodies <20.0; tpo <10.0; free t3 2.9;  free t4: 1.26  09-10-12: wbc 6.4; hgb 10.8; hct 30.9; mcv 57.8; plt 208; glucose 80; bun 16; creat 1.01; k+4.6; na++139; liver normal albumin 3.9  03-25-13: glucose 132; bun 14; creat 1.03; k+3.8; na++  137; liver normal albumin 4.4 PSA 0.89  04-26-13: wbc 5.8; hgb 10.6; hct 31.5 ;mcv 89.2 ;plt 220  Iron and IBC  Result: 07/24/2013 11:21 AM ( Status: F ) C  Iron 61 42-165 ug/dL SLN  UIBC 202 125-400 ug/dL SLN  TIBC 263 215-435 ug/dL SLN  %SAT 23 20-55 % SLN  CBC NO Diff (Complete Blood Count)  Result: 07/24/2013 10:22 AM ( Status: F )  WBC 6.0 4.0-10.5 K/uL SLN  RBC 5.28 4.22-5.81 MIL/uL SLN  Hemoglobin 10.5 L 13.0-17.0 g/dL SLN  Hematocrit 31.2 L 39.0-52.0 % SLN  MCV 59.1 L 78.0-100.0 fL SLN  MCH 19.9 L 26.0-34.0 pg SLN  MCHC 33.7 30.0-36.0 g/dL SLN  RDW 16.9 H 11.5-15.5 % SLN  Platelet Count 215 150-400 K/uL SLN  Comprehensive Metabolic Panel  Result: 7/0/4888  11:21 AM ( Status: F )  Sodium 141 135-145 mEq/L SLN  Potassium 4.2 3.5-5.3 mEq/L SLN  Chloride 105 96-112 mEq/L SLN  CO2 27 19-32 mEq/L SLN  Glucose 77 70-99 mg/dL SLN  BUN 18 6-23 mg/dL SLN  Creatinine 0.99 0.50-1.35 mg/dL SLN  Bilirubin, Total 0.5 0.2-1.2 mg/dL SLN C  Alkaline Phosphatase 77 39-117 U/L SLN  AST/SGOT 14 0-37 U/L SLN  ALT/SGPT 10 0-53 U/L SLN  Total Protein 6.7 6.0-8.3 g/dL SLN  Albumin 3.7 3.5-5.2 g/dL SLN  Calcium 9.2 8.4-10.5 mg/dL SLN  TSH, Ultrasensitive  Result: 07/24/2013 2:12 PM ( Status: F )  TSH 0.681 0.350-4.500 uIU/mL SLN  Lipid Profile  Result: 07/03/2013 11:24 AM ( Status: F ) C  Cholesterol 171 0-200 mg/dL SLN C  Triglyceride 89 <150 mg/dL SLN  HDL Cholesterol 38 L >39 mg/dL SLN  Total Chol/HDL Ratio 4.5 Ratio SLN  VLDL Cholesterol (Calc) 18 0-40 mg/dL SLN  LDL Cholesterol (Calc) 115 H 0-99 mg/dL SLN C  Vitamin D (25-Hydroxy)  Result: 07/03/2013 1:09 PM ( Status: F )  Vitamin D (25-Hydroxy) 46 30-89 ng/mL SLN CBC with Diff    Result: 11/20/2013 10:41 AM   ( Status: F )     C WBC 7.6     4.0-10.5 K/uL SLN   RBC 5.72     4.22-5.81 MIL/uL SLN   Hemoglobin 11.5   L 13.0-17.0 g/dL SLN   Hematocrit 33.1   L 39.0-52.0 % SLN   MCV 57.9   L 78.0-100.0 fL SLN   MCH 20.1   L 26.0-34.0 pg SLN   MCHC 34.7     30.0-36.0 g/dL SLN   RDW 17.1   H 11.5-15.5 % SLN   Platelet Count 245     150-400 K/uL SLN   Granulocyte % 65     43-77 % SLN   Absolute Gran 4.9     1.7-7.7 K/uL SLN   Lymph % 21     12-46 % SLN   Absolute Lymph 1.6     0.7-4.0 K/uL SLN   Mono % 9     3-12 % SLN   Absolute Mono 0.7     0.1-1.0 K/uL SLN   Eos % 5     0-5 % SLN   Absolute Eos 0.4     0.0-0.7 K/uL SLN   Baso % 0     0-1 % SLN   Absolute Baso 0.0     0.0-0.1 K/uL SLN   Smear Review Criteria for review not met  SLN   Comprehensive Metabolic Panel    Result: 11/20/2013 10:52 AM   ( Status: F )  Sodium 140     135-145 mEq/L SLN   Potassium 4.3     3.5-5.3 mEq/L SLN    Chloride 104     96-112 mEq/L SLN   CO2 26     19-32 mEq/L SLN   Glucose 71     70-99 mg/dL SLN   BUN 17     6-23 mg/dL SLN   Creatinine 0.96     0.50-1.35 mg/dL SLN   Bilirubin, Total 0.8     0.2-1.2 mg/dL SLN   Alkaline Phosphatase 62     39-117 U/L SLN   AST/SGOT 13     0-37 U/L SLN   ALT/SGPT 15     0-53 U/L SLN   Total Protein 7.3     6.0-8.3 g/dL SLN   Albumin 4.1     3.5-5.2 g/dL SLN   Calcium 9.2     8.4-10.5 mg/dL SLN   Lipid Profile    Result: 11/20/2013 10:52 AM   ( Status: F )       Cholesterol 175     0-200 mg/dL SLN C Triglyceride 137     <150 mg/dL SLN   HDL Cholesterol 45     >39 mg/dL SLN   Total Chol/HDL Ratio 3.9      Ratio SLN   VLDL Cholesterol (Calc) 27     0-40 mg/dL SLN   LDL Cholesterol (Calc) 103   H 0-99 mg/dL SLN   Liver Profile    Result: 12/27/2013 12:08 PM   ( Status: F )       Bilirubin, Total 0.8     0.2-1.2 mg/dL SLN   Bilirubin, Direct 0.1     0.0-0.3 mg/dL SLN   Indirect Bilirubin 0.7     0.2-1.2 mg/dL SLN   Alkaline Phosphatase 62     39-117 U/L SLN   AST/SGOT 13     0-37 U/L SLN   ALT/SGPT 15     0-53 U/L SLN   Total Protein 7.2     6.0-8.3 g/dL SLN   Albumin 4.0   Assessment/Plan  1. Dysphagia, pharyngoesophageal phase Stable at this time, aspiration precautions in place   2. Essential hypertension, benign Stable at this time, will follow up labs since she is on ACE  3. 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 conts with braces to support extremities  -conts on baclofen   4. Hyperlipidemia  Will follow up lipid panel  5. Vit D def  On Vit D supplement, will follow up Vit D level

## 2014-06-05 ENCOUNTER — Ambulatory Visit: Payer: Medicare Other | Admitting: Gastroenterology

## 2014-06-30 ENCOUNTER — Non-Acute Institutional Stay (SKILLED_NURSING_FACILITY): Payer: Medicare Other | Admitting: Nurse Practitioner

## 2014-06-30 DIAGNOSIS — I69959 Hemiplegia and hemiparesis following unspecified cerebrovascular disease affecting unspecified side: Secondary | ICD-10-CM

## 2014-06-30 DIAGNOSIS — G8190 Hemiplegia, unspecified affecting unspecified side: Secondary | ICD-10-CM

## 2014-06-30 DIAGNOSIS — I10 Essential (primary) hypertension: Secondary | ICD-10-CM

## 2014-06-30 DIAGNOSIS — D649 Anemia, unspecified: Secondary | ICD-10-CM

## 2014-06-30 DIAGNOSIS — E785 Hyperlipidemia, unspecified: Secondary | ICD-10-CM

## 2014-06-30 DIAGNOSIS — E559 Vitamin D deficiency, unspecified: Secondary | ICD-10-CM

## 2014-06-30 NOTE — Progress Notes (Signed)
Patient ID: Philip [name], male   DOB: 09-Dec-1950, 64 y.o.   MRN: [id number]   Nursing Home Location:  [place] of Service: SNF (31)  PCP: Cyndee Brightly, MD  No Known Allergies  Chief Complaint  Patient presents with  . Medical Management of Chronic Issues    HPI:  64 y.o. 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 Pt has had no acute issues in the last month however therapy reports worsening spasms in ride side which was effected from CVA. Reports worsening balanced and weakness of right ankle requiring more assistance with ADLs that he was once doing by himself. Pt conts to complain on right toe pain and is having worsening contractures. Therapy mentioned going to neurology for Botox to help with these contractures and spasms.  Review of Systems:  Unable to provide ROS due to aphagia    Past Medical History  Diagnosis Date  . CVA (cerebral vascular accident)   . Hemiplegia affecting dominant side, late effect of cerebrovascular disease   . Vitamin D deficiency   . Essential hypertension, benign   . GERD (gastroesophageal reflux disease)   . Anemia   . Thyroid disease   . Pneumonia   . DYSPHAGIA 07/09/2008    Qualifier: Diagnosis of  By: Ardis Hughs MD, Melene Plan   . Intracerebral hemorrhage   . Risk for falls    Past Surgical History  Procedure Laterality Date  . Peg placement     Social History:   has no tobacco, alcohol, and drug history on file.  No family history on file.  Medications: Patient's Medications  New Prescriptions   No medications on file  Previous Medications   ACETAMINOPHEN (TYLENOL) 500 MG TABLET    Take 1,000 mg by mouth 2 (two) times daily.   BACLOFEN (LIORESAL) 10 MG TABLET    Take 10 mg by mouth 3 (three) times daily.    BENAZEPRIL (LOTENSIN)  10 MG TABLET    Take 10 mg by mouth daily.   CHOLECALCIFEROL (VITAMIN D) 1000 UNITS TABLET    Take 2,000 Units by mouth daily.   LABETALOL (NORMODYNE) 100 MG TABLET    Take 100 mg by mouth daily.  Modified Medications   No medications on file  Discontinued Medications   No medications on file     Physical Exam:  Filed Vitals:   06/30/14 1104  BP: 130/86  Pulse: 68  Temp: 98 F (36.7 C)  Resp: 18  Weight: 130 lb (58.968 kg)     Physical Exam  Constitutional: He is well-developed, well-nourished, and in no distress.  HENT:  Head: Normocephalic and atraumatic.  Mouth/Throat: Oropharynx is clear and moist. No oropharyngeal exudate.  Eyes: Conjunctivae and EOM are normal. Pupils are equal, round, and reactive to light.  Neck: Normal range of motion. Neck supple.  Cardiovascular: Normal rate, regular rhythm and normal heart sounds.   Pulmonary/Chest: Effort normal and breath sounds normal.  Abdominal: Soft. Bowel sounds are normal.  Musculoskeletal: He exhibits tenderness (to right toes. ). He exhibits no edema.  Brace to right wrist and lower extremity right sided hemiparesis   Neurological: He is alert.  Skin: Skin is warm and dry.  Psychiatric: Affect normal.    Labs reviewed: 08-08-11: tsh 0.619; FTI 3.5; thyroglobulin 3.0; thyroid antibodies <20.0;  tpo <10.0; free t3 2.9;  free t4: 1.26  09-10-12: wbc 6.4; hgb 10.8; hct 30.9; mcv 57.8; plt 208; glucose 80; bun 16; creat 1.01; k+4.6; na++139; liver normal albumin 3.9  03-25-13: glucose 132; bun 14; creat 1.03; k+3.8; na++ 137; liver normal albumin 4.4 PSA 0.89  04-26-13: wbc 5.8; hgb 10.6; hct 31.5 ;mcv 89.2 ;plt 220  Iron and IBC  Result: 07/24/2013 11:21 AM ( Status: F ) C  Iron 61 42-165 ug/dL SLN  UIBC 202 125-400 ug/dL SLN  TIBC 263 215-435 ug/dL SLN  %SAT 23 20-55 % SLN  CBC NO Diff (Complete Blood Count)  Result: 07/24/2013 10:22 AM ( Status: F )  WBC 6.0 4.0-10.5 K/uL SLN  RBC 5.28 4.22-5.81 MIL/uL SLN    Hemoglobin 10.5 L 13.0-17.0 g/dL SLN  Hematocrit 31.2 L 39.0-52.0 % SLN  MCV 59.1 L 78.0-100.0 fL SLN  MCH 19.9 L 26.0-34.0 pg SLN  MCHC 33.7 30.0-36.0 g/dL SLN  RDW 16.9 H 11.5-15.5 % SLN  Platelet Count 215 150-400 K/uL SLN  Comprehensive Metabolic Panel  Result: 06/21/9415 11:21 AM ( Status: F )  Sodium 141 135-145 mEq/L SLN  Potassium 4.2 3.5-5.3 mEq/L SLN  Chloride 105 96-112 mEq/L SLN  CO2 27 19-32 mEq/L SLN  Glucose 77 70-99 mg/dL SLN  BUN 18 6-23 mg/dL SLN  Creatinine 0.99 0.50-1.35 mg/dL SLN  Bilirubin, Total 0.5 0.2-1.2 mg/dL SLN C  Alkaline Phosphatase 77 39-117 U/L SLN  AST/SGOT 14 0-37 U/L SLN  ALT/SGPT 10 0-53 U/L SLN  Total Protein 6.7 6.0-8.3 g/dL SLN  Albumin 3.7 3.5-5.2 g/dL SLN  Calcium 9.2 8.4-10.5 mg/dL SLN  TSH, Ultrasensitive  Result: 07/24/2013 2:12 PM ( Status: F )  TSH 0.681 0.350-4.500 uIU/mL SLN  Lipid Profile  Result: 07/03/2013 11:24 AM ( Status: F ) C  Cholesterol 171 0-200 mg/dL SLN C  Triglyceride 89 <150 mg/dL SLN  HDL Cholesterol 38 L >39 mg/dL SLN  Total Chol/HDL Ratio 4.5 Ratio SLN  VLDL Cholesterol (Calc) 18 0-40 mg/dL SLN  LDL Cholesterol (Calc) 115 H 0-99 mg/dL SLN C  Vitamin D (25-Hydroxy)  Result: 07/03/2013 1:09 PM ( Status: F )  Vitamin D (25-Hydroxy) 46 30-89 ng/mL SLN CBC with Diff    Result: 11/20/2013 10:41 AM   ( Status: F )     C WBC 7.6     4.0-10.5 K/uL SLN   RBC 5.72     4.22-5.81 MIL/uL SLN   Hemoglobin 11.5   L 13.0-17.0 g/dL SLN   Hematocrit 33.1   L 39.0-52.0 % SLN   MCV 57.9   L 78.0-100.0 fL SLN   MCH 20.1   L 26.0-34.0 pg SLN   MCHC 34.7     30.0-36.0 g/dL SLN   RDW 17.1   H 11.5-15.5 % SLN   Platelet Count 245     150-400 K/uL SLN   Granulocyte % 65     43-77 % SLN   Absolute Gran 4.9     1.7-7.7 K/uL SLN   Lymph % 21     12-46 % SLN   Absolute Lymph 1.6     0.7-4.0 K/uL SLN   Mono % 9     3-12 % SLN   Absolute Mono 0.7     0.1-1.0 K/uL SLN   Eos % 5     0-5 % SLN   Absolute Eos 0.4     0.0-0.7 K/uL SLN    Baso % 0     0-1 % SLN  Absolute Baso 0.0     0.0-0.1 K/uL SLN   Smear Review Criteria for review not met  SLN   Comprehensive Metabolic Panel    Result: 11/20/2013 10:52 AM   ( Status: F )       Sodium 140     135-145 mEq/L SLN   Potassium 4.3     3.5-5.3 mEq/L SLN   Chloride 104     96-112 mEq/L SLN   CO2 26     19-32 mEq/L SLN   Glucose 71     70-99 mg/dL SLN   BUN 17     6-23 mg/dL SLN   Creatinine 0.96     0.50-1.35 mg/dL SLN   Bilirubin, Total 0.8     0.2-1.2 mg/dL SLN   Alkaline Phosphatase 62     39-117 U/L SLN   AST/SGOT 13     0-37 U/L SLN   ALT/SGPT 15     0-53 U/L SLN   Total Protein 7.3     6.0-8.3 g/dL SLN   Albumin 4.1     3.5-5.2 g/dL SLN   Calcium 9.2     8.4-10.5 mg/dL SLN   Lipid Profile    Result: 11/20/2013 10:52 AM   ( Status: F )       Cholesterol 175     0-200 mg/dL SLN C Triglyceride 137     <150 mg/dL SLN   HDL Cholesterol 45     >39 mg/dL SLN   Total Chol/HDL Ratio 3.9      Ratio SLN   VLDL Cholesterol (Calc) 27     0-40 mg/dL SLN   LDL Cholesterol (Calc) 103   H 0-99 mg/dL SLN   Liver Profile    Result: 12/27/2013 12:08 PM   ( Status: F )       Bilirubin, Total 0.8     0.2-1.2 mg/dL SLN   Bilirubin, Direct 0.1     0.0-0.3 mg/dL SLN   Indirect Bilirubin 0.7     0.2-1.2 mg/dL SLN   Alkaline Phosphatase 62     39-117 U/L SLN   AST/SGOT 13     0-37 U/L SLN   ALT/SGPT 15     0-53 U/L SLN   Total Protein 7.2     6.0-8.3 g/dL SLN   Albumin 4.0  odium 141     135-145 mEq/L WML   Potassium 4.3     3.5-5.3 mEq/L WML   Chloride 105     96-112 mEq/L WML   CO2 27     19-32 mEq/L WML   Glucose 76     70-99 mg/dL WML   BUN 14     6-23 mg/dL WML   Creatinine 1.00     0.50-1.35 mg/dL WML   Calcium 9.3     8.4-10.5 mg/dL WML   CBC NO Diff (Complete Blood Count)    Result: 06/04/2014 12:19 PM   ( Status: F )     C WBC 6.0     4.0-10.5 K/uL SLN   RBC 5.31     4.22-5.81 MIL/uL SLN   Hemoglobin 10.8   L 13.0-17.0 g/dL SLN   Hematocrit 30.5   L 39.0-52.0 % SLN    MCV 57.4   L 78.0-100.0 fL SLN   MCH 20.3   L 26.0-34.0 pg SLN   MCHC 35.4     30.0-36.0 g/dL SLN   RDW 16.2   H 11.5-15.5 % SLN   Platelet Count 266  150-400 K/uL SLN   MPV 9.1   L 9.4-12.4 fL SLN   Comprehensive Metabolic Panel    Result: 06/04/2014 11:15 AM   ( Status: F )       Sodium 139     135-145 mEq/L SLN   Potassium 4.0     3.5-5.3 mEq/L SLN   Chloride 104     96-112 mEq/L SLN   CO2 27     19-32 mEq/L SLN   Glucose 82     70-99 mg/dL SLN   BUN 12     6-23 mg/dL SLN   Creatinine 0.90     0.50-1.35 mg/dL SLN   Bilirubin, Total 0.6     0.2-1.2 mg/dL SLN   Alkaline Phosphatase 74     39-117 U/L SLN   AST/SGOT 10     0-37 U/L SLN   ALT/SGPT 11     0-53 U/L SLN   Total Protein 7.1     6.0-8.3 g/dL SLN   Albumin 3.5     3.5-5.2 g/dL SLN   Calcium 9.2     8.4-10.5 mg/dL SLN   Lipid Panel    Result: 06/04/2014 11:15 AM   ( Status: F )       Cholesterol 169     0-200 mg/dL SLN C Triglyceride 141     <150 mg/dL SLN   HDL Cholesterol 41     >39 mg/dL SLN   Total Chol/HDL Ratio 4.1      Ratio SLN   VLDL Cholesterol (Calc) 28     0-40 mg/dL SLN   LDL Cholesterol (Calc) 100   H 0-99 mg/dL SLN C Vitamin D, 25-Hydroxy, Total    Result: 06/04/2014 3:01 PM   ( Status: F )       Vitamin D (25-Hydroxy) 47     30-100 ng/mL SLN C   Assessment/Plan  1. Essential hypertension, benign Blood pressure in adequate controlled on current regimen   2. 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 Will send to neurology for ongoing evaluation -conts on baclofen  3. Vitamin D deficiency Vit D stable, cont current regimen  4. Hyperlipemia -LDL at 100, not currently on medications, will cont to monitor   5. Anemia, unspecified anemia type hgb remains stable

## 2014-07-28 ENCOUNTER — Non-Acute Institutional Stay (SKILLED_NURSING_FACILITY): Payer: Medicare Other | Admitting: Nurse Practitioner

## 2014-07-28 DIAGNOSIS — I10 Essential (primary) hypertension: Secondary | ICD-10-CM

## 2014-07-28 DIAGNOSIS — G8190 Hemiplegia, unspecified affecting unspecified side: Secondary | ICD-10-CM

## 2014-07-28 DIAGNOSIS — I69959 Hemiplegia and hemiparesis following unspecified cerebrovascular disease affecting unspecified side: Secondary | ICD-10-CM

## 2014-07-28 DIAGNOSIS — R1314 Dysphagia, pharyngoesophageal phase: Secondary | ICD-10-CM

## 2014-07-28 NOTE — Progress Notes (Signed)
Patient ID: Philip [name], male   DOB: 08-10-1950, 64 y.o.   MRN: [id number]   Nursing Home Location:  [place] of Service: SNF (31)  PCP: Cyndee Brightly, MD  No Known Allergies  Chief Complaint  Patient presents with  . Medical Management of Chronic Issues    HPI:  64 y.o. 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 Pt has had no acute issues in the last month. Last month neurology consult was written however pt has not been seen. Therapy signing off but reports worsening spasms in ride side which is affecting his ADLs that he was previously able to do.   Review of Systems:  Unable to provide ROS due to aphagia    Past Medical History  Diagnosis Date  . CVA (cerebral vascular accident)   . Hemiplegia affecting dominant side, late effect of cerebrovascular disease   . Vitamin D deficiency   . Essential hypertension, benign   . GERD (gastroesophageal reflux disease)   . Anemia   . Thyroid disease   . Pneumonia   . DYSPHAGIA 07/09/2008    Qualifier: Diagnosis of  By: Ardis Hughs MD, Melene Plan   . Intracerebral hemorrhage   . Risk for falls    Past Surgical History  Procedure Laterality Date  . Peg placement     Social History:   has no tobacco, alcohol, and drug history on file.  No family history on file.  Medications: Patient's Medications  New Prescriptions   No medications on file  Previous Medications   ACETAMINOPHEN (TYLENOL) 500 MG TABLET    Take 1,000 mg by mouth 2 (two) times daily.   BACLOFEN (LIORESAL) 10 MG TABLET    Take 10 mg by mouth 3 (three) times daily.    BENAZEPRIL (LOTENSIN) 10 MG TABLET    Take 10 mg by mouth daily.   CHOLECALCIFEROL (VITAMIN D) 1000 UNITS TABLET    Take 2,000 Units by mouth daily.   LABETALOL (NORMODYNE) 100 MG TABLET    Take  100 mg by mouth daily.  Modified Medications   No medications on file  Discontinued Medications   No medications on file     Physical Exam:  Filed Vitals:   07/28/14 1708  BP: 122/83  Pulse: 69  Temp: 97.4 F (36.3 C)  Resp: 20  Weight: 138 lb (62.596 kg)     Physical Exam  Constitutional: He is well-developed, well-nourished, and in no distress.  HENT:  Head: Normocephalic and atraumatic.  Mouth/Throat: Oropharynx is clear and moist. No oropharyngeal exudate.  Eyes: Conjunctivae and EOM are normal. Pupils are equal, round, and reactive to light.  Cardiovascular: Normal rate, regular rhythm and normal heart sounds.   Pulmonary/Chest: Effort normal and breath sounds normal.  Abdominal: Soft. Bowel sounds are normal.  Musculoskeletal: He exhibits tenderness (to right toes. ). He exhibits no edema.  Brace to right wrist and lower extremity right sided hemiparesis   Neurological: He is alert.  Skin: Skin is warm and dry.  Psychiatric: Affect normal.    Labs reviewed: 08-08-11: tsh 0.619; FTI 3.5; thyroglobulin 3.0; thyroid antibodies <20.0; tpo <10.0; free t3 2.9;  free t4: 1.26  09-10-12: wbc 6.4; hgb 10.8; hct 30.9; mcv 57.8; plt 208; glucose 80; bun 16; creat 1.01; k+4.6; na++139; liver normal albumin 3.9  03-25-13: glucose 132; bun 14; creat 1.03; k+3.8; na++ 137; liver normal albumin 4.4 PSA 0.89  04-26-13: wbc 5.8; hgb 10.6; hct 31.5 ;mcv 89.2 ;plt 220  Iron and IBC  Result: 07/24/2013 11:21 AM ( Status: F ) C  Iron 61 42-165 ug/dL SLN  UIBC 202 125-400 ug/dL SLN  TIBC 263 215-435 ug/dL SLN  %SAT 23 20-55 % SLN  CBC NO Diff (Complete Blood Count)  Result: 07/24/2013 10:22 AM ( Status: F )  WBC 6.0 4.0-10.5 K/uL SLN  RBC 5.28 4.22-5.81 MIL/uL SLN  Hemoglobin 10.5 L 13.0-17.0 g/dL SLN  Hematocrit 31.2 L 39.0-52.0 % SLN  MCV 59.1 L 78.0-100.0 fL SLN  MCH 19.9 L 26.0-34.0 pg SLN  MCHC 33.7 30.0-36.0 g/dL SLN  RDW 16.9 H 11.5-15.5 % SLN  Platelet Count 215 150-400 K/uL  SLN  Comprehensive Metabolic Panel  Result: 06/25/1094 11:21 AM ( Status: F )  Sodium 141 135-145 mEq/L SLN  Potassium 4.2 3.5-5.3 mEq/L SLN  Chloride 105 96-112 mEq/L SLN  CO2 27 19-32 mEq/L SLN  Glucose 77 70-99 mg/dL SLN  BUN 18 6-23 mg/dL SLN  Creatinine 0.99 0.50-1.35 mg/dL SLN  Bilirubin, Total 0.5 0.2-1.2 mg/dL SLN C  Alkaline Phosphatase 77 39-117 U/L SLN  AST/SGOT 14 0-37 U/L SLN  ALT/SGPT 10 0-53 U/L SLN  Total Protein 6.7 6.0-8.3 g/dL SLN  Albumin 3.7 3.5-5.2 g/dL SLN  Calcium 9.2 8.4-10.5 mg/dL SLN  TSH, Ultrasensitive  Result: 07/24/2013 2:12 PM ( Status: F )  TSH 0.681 0.350-4.500 uIU/mL SLN  Lipid Profile  Result: 07/03/2013 11:24 AM ( Status: F ) C  Cholesterol 171 0-200 mg/dL SLN C  Triglyceride 89 <150 mg/dL SLN  HDL Cholesterol 38 L >39 mg/dL SLN  Total Chol/HDL Ratio 4.5 Ratio SLN  VLDL Cholesterol (Calc) 18 0-40 mg/dL SLN  LDL Cholesterol (Calc) 115 H 0-99 mg/dL SLN C  Vitamin D (25-Hydroxy)  Result: 07/03/2013 1:09 PM ( Status: F )  Vitamin D (25-Hydroxy) 46 30-89 ng/mL SLN CBC with Diff    Result: 11/20/2013 10:41 AM   ( Status: F )     C WBC 7.6     4.0-10.5 K/uL SLN   RBC 5.72     4.22-5.81 MIL/uL SLN   Hemoglobin 11.5   L 13.0-17.0 g/dL SLN   Hematocrit 33.1   L 39.0-52.0 % SLN   MCV 57.9   L 78.0-100.0 fL SLN   MCH 20.1   L 26.0-34.0 pg SLN   MCHC 34.7     30.0-36.0 g/dL SLN   RDW 17.1   H 11.5-15.5 % SLN   Platelet Count 245     150-400 K/uL SLN   Granulocyte % 65     43-77 % SLN   Absolute Gran 4.9     1.7-7.7 K/uL SLN   Lymph % 21     12-46 % SLN   Absolute Lymph 1.6     0.7-4.0 K/uL SLN   Mono % 9     3-12 % SLN   Absolute Mono 0.7     0.1-1.0 K/uL SLN   Eos % 5     0-5 % SLN   Absolute Eos 0.4     0.0-0.7 K/uL SLN   Baso % 0     0-1 % SLN   Absolute Baso 0.0     0.0-0.1 K/uL SLN   Smear Review Criteria for review not met  SLN   Comprehensive Metabolic Panel    Result: 11/20/2013 10:52 AM   (  Status: F )       Sodium 140      135-145 mEq/L SLN   Potassium 4.3     3.5-5.3 mEq/L SLN   Chloride 104     96-112 mEq/L SLN   CO2 26     19-32 mEq/L SLN   Glucose 71     70-99 mg/dL SLN   BUN 17     6-23 mg/dL SLN   Creatinine 0.96     0.50-1.35 mg/dL SLN   Bilirubin, Total 0.8     0.2-1.2 mg/dL SLN   Alkaline Phosphatase 62     39-117 U/L SLN   AST/SGOT 13     0-37 U/L SLN   ALT/SGPT 15     0-53 U/L SLN   Total Protein 7.3     6.0-8.3 g/dL SLN   Albumin 4.1     3.5-5.2 g/dL SLN   Calcium 9.2     8.4-10.5 mg/dL SLN   Lipid Profile    Result: 11/20/2013 10:52 AM   ( Status: F )       Cholesterol 175     0-200 mg/dL SLN C Triglyceride 137     <150 mg/dL SLN   HDL Cholesterol 45     >39 mg/dL SLN   Total Chol/HDL Ratio 3.9      Ratio SLN   VLDL Cholesterol (Calc) 27     0-40 mg/dL SLN   LDL Cholesterol (Calc) 103   H 0-99 mg/dL SLN   Liver Profile    Result: 12/27/2013 12:08 PM   ( Status: F )       Bilirubin, Total 0.8     0.2-1.2 mg/dL SLN   Bilirubin, Direct 0.1     0.0-0.3 mg/dL SLN   Indirect Bilirubin 0.7     0.2-1.2 mg/dL SLN   Alkaline Phosphatase 62     39-117 U/L SLN   AST/SGOT 13     0-37 U/L SLN   ALT/SGPT 15     0-53 U/L SLN   Total Protein 7.2     6.0-8.3 g/dL SLN   Albumin 4.0  odium 141     135-145 mEq/L WML   Potassium 4.3     3.5-5.3 mEq/L WML   Chloride 105     96-112 mEq/L WML   CO2 27     19-32 mEq/L WML   Glucose 76     70-99 mg/dL WML   BUN 14     6-23 mg/dL WML   Creatinine 1.00     0.50-1.35 mg/dL WML   Calcium 9.3     8.4-10.5 mg/dL WML   CBC NO Diff (Complete Blood Count)    Result: 06/04/2014 12:19 PM   ( Status: F )     C WBC 6.0     4.0-10.5 K/uL SLN   RBC 5.31     4.22-5.81 MIL/uL SLN   Hemoglobin 10.8   L 13.0-17.0 g/dL SLN   Hematocrit 30.5   L 39.0-52.0 % SLN   MCV 57.4   L 78.0-100.0 fL SLN   MCH 20.3   L 26.0-34.0 pg SLN   MCHC 35.4     30.0-36.0 g/dL SLN   RDW 16.2   H 11.5-15.5 % SLN   Platelet Count 266     150-400 K/uL SLN   MPV 9.1   L 9.4-12.4 fL SLN    Comprehensive Metabolic Panel    Result: 06/04/2014 11:15 AM   ( Status: F )  Sodium 139     135-145 mEq/L SLN   Potassium 4.0     3.5-5.3 mEq/L SLN   Chloride 104     96-112 mEq/L SLN   CO2 27     19-32 mEq/L SLN   Glucose 82     70-99 mg/dL SLN   BUN 12     6-23 mg/dL SLN   Creatinine 0.90     0.50-1.35 mg/dL SLN   Bilirubin, Total 0.6     0.2-1.2 mg/dL SLN   Alkaline Phosphatase 74     39-117 U/L SLN   AST/SGOT 10     0-37 U/L SLN   ALT/SGPT 11     0-53 U/L SLN   Total Protein 7.1     6.0-8.3 g/dL SLN   Albumin 3.5     3.5-5.2 g/dL SLN   Calcium 9.2     8.4-10.5 mg/dL SLN   Lipid Panel    Result: 06/04/2014 11:15 AM   ( Status: F )       Cholesterol 169     0-200 mg/dL SLN C Triglyceride 141     <150 mg/dL SLN   HDL Cholesterol 41     >39 mg/dL SLN   Total Chol/HDL Ratio 4.1      Ratio SLN   VLDL Cholesterol (Calc) 28     0-40 mg/dL SLN   LDL Cholesterol (Calc) 100   H 0-99 mg/dL SLN C Vitamin D, 25-Hydroxy, Total    Result: 06/04/2014 3:01 PM   ( Status: F )       Vitamin D (25-Hydroxy) 47     30-100 ng/mL SLN C   Assessment/Plan  1. 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 Dysphagia, pharyngoesophageal phase Stable on current diet, without signs of aspiration   3. Essential hypertension, benign Stable on current regimen

## 2014-08-18 ENCOUNTER — Non-Acute Institutional Stay (SKILLED_NURSING_FACILITY): Payer: Medicare Other | Admitting: Internal Medicine

## 2014-08-18 DIAGNOSIS — I69959 Hemiplegia and hemiparesis following unspecified cerebrovascular disease affecting unspecified side: Secondary | ICD-10-CM

## 2014-08-18 DIAGNOSIS — G8190 Hemiplegia, unspecified affecting unspecified side: Secondary | ICD-10-CM | POA: Diagnosis not present

## 2014-08-18 DIAGNOSIS — R1314 Dysphagia, pharyngoesophageal phase: Secondary | ICD-10-CM

## 2014-08-22 NOTE — Progress Notes (Signed)
Patient ID: Philip [name], male   DOB: October 07, 1950, 64 y.o.   MRN: [id number]              PROGRESS NOTE  DATE:  08/18/2014             [facility]AC[facility]      LEVEL OF CARE:   SNF   Routine Visit   CHIEF COMPLAINT:  Review of medical issues/transfer to [facility].    HISTORY OF PRESENT ILLNESS:  This is a 64 year-old man with a history of a left basal ganglia hemorrhage, now with aphasia, dysphagia, and a right-sided hemiparesis.    He has had right upper extremity spasticity.  He is able to propel himself around the facility.    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    Vitamin D deficiency.    Essential hypertension.    Gastroesophageal reflux disease.      History of dysphagia.    CURRENT MEDICATIONS:  Medication list is reviewed.        Labetalol 100 daily.    Vitamin D3, 2000 daily.    Lotensin 10 q.d.       Baclofen 10 mg three times a day.    PHYSICAL EXAMINATION:                   CHEST/RESPIRATORY:  Clear air entry bilaterally.   CARDIOVASCULAR:  CARDIAC:   Heart sounds are normal.   GASTROINTESTINAL:  ABDOMEN:   Bowel sounds are normal.   No tenderness.  No masses.     LABORATORY DATA:  Recent lab work includes:    Normal basic metabolic panel on 06/06/2014.       ASSESSMENT/PLAN:                        Late-effect dominant hemisphere intracerebral hemorrhage.  Apparently has had some notification of worsening gait and increasing spasticity.  There was some suggestion about another neurology referral.    Dysphagia.    He at one point had a PEG tube, which was removed.  He is stable on his current diet.    Hypertension.  This is well controlled.

## 2014-10-21 ENCOUNTER — Non-Acute Institutional Stay (SKILLED_NURSING_FACILITY): Payer: Medicare Other | Admitting: Internal Medicine

## 2014-10-21 DIAGNOSIS — I69959 Hemiplegia and hemiparesis following unspecified cerebrovascular disease affecting unspecified side: Secondary | ICD-10-CM

## 2014-10-21 DIAGNOSIS — D649 Anemia, unspecified: Secondary | ICD-10-CM

## 2014-10-21 DIAGNOSIS — G8190 Hemiplegia, unspecified affecting unspecified side: Secondary | ICD-10-CM

## 2014-10-23 NOTE — Progress Notes (Addendum)
Patient ID: Philip [name], male   DOB: [birth year], 64 y.o.   MRN: [id number]                PROGRESS NOTE  DATE:  10/21/2014           FACILITY: [facility]                      LEVEL OF CARE:   SNF   Routine Visit                   CHIEF COMPLAINT:  Routine visit/[facility] visit.      HISTORY OF PRESENT ILLNESS:  This is a 64 year-old man who came to us several years ago, having a left basal ganglia hemorrhage with aphasia, dysphagia, and right-sided hemiparesis.  He recovered swallowing function.  He is able to move himself around the facility in a wheelchair.  I think he is mostly continent.  He has right upper extremity spasticity.    His vital signs, including his blood pressure, have been stable.     CURRENT MEDICATIONS:  Medication list is reviewed.    Labetalol 100 q.d.       Vitamin D3, 2000 U daily.     Lotensin 10 q.d.       Tylenol Extra-Strength 1000 b.i.d.       Baclofen 10 mg three times a day.    LABORATORY DATA:  Lab work from April:    Iron 81, TIBC 251.    Hemoglobin 10.2, microcytic, hypochromic indices, white count 7.3, platelet count 252.     Vitamin B12 level 680, folate 8.8, ferritin level 309.      PHYSICAL EXAMINATION:   VITAL SIGNS:     PULSE:  74.     RESPIRATIONS:  16 and unlabored.   02 SATURATIONS:  97% on room air.   CHEST/RESPIRATORY:  Clear air entry bilaterally.    CARDIOVASCULAR:   CARDIAC:  Heart sounds are normal.   GASTROINTESTINAL:   ABDOMEN:  Soft.  There is no tenderness.     ASSESSMENT/PLAN:                   Microcytic, hypochromic anemia.  This was last checked in February 2015 at which time his hemoglobin was 10.5, also microcytic and hypochromic.  In November 2014, 10.6 with the same pattern.  I think this is unlikely to be iron deficiency.  This may be thalassemia trait, as well.    Late-effect dominant hemisphere CVA.  He remains aphasic with right upper greater than lower extremity paralysis, although he is still  quite functional in the facility.    Hypertension.  This is well controlled.

## 2015-01-10 ENCOUNTER — Non-Acute Institutional Stay (SKILLED_NURSING_FACILITY): Payer: Medicare Other | Admitting: Internal Medicine

## 2015-01-10 ENCOUNTER — Encounter: Payer: Self-pay | Admitting: Internal Medicine

## 2015-01-10 DIAGNOSIS — I69959 Hemiplegia and hemiparesis following unspecified cerebrovascular disease affecting unspecified side: Secondary | ICD-10-CM

## 2015-01-10 DIAGNOSIS — I10 Essential (primary) hypertension: Secondary | ICD-10-CM | POA: Diagnosis not present

## 2015-01-10 DIAGNOSIS — G8190 Hemiplegia, unspecified affecting unspecified side: Secondary | ICD-10-CM | POA: Diagnosis not present

## 2015-01-10 DIAGNOSIS — Z66 Do not resuscitate: Secondary | ICD-10-CM | POA: Insufficient documentation

## 2015-01-13 NOTE — Progress Notes (Addendum)
Patient ID: Philip [name], male   DOB: 07-05-1950, 64 y.o.   MRN: [id number]                PROGRESS NOTE  DATE:  01/10/2015           FACILITY: [facility]                        LEVEL OF CARE:   SNF   Routine Visit                   CHIEF COMPLAINT:  Routine visit to follow medical issues/[facility] visit.      HISTORY OF PRESENT ILLNESS:  This is a 64 year-old man who came to [place] several years ago having a left basal ganglia hemorrhage with aphasia, dysphagia, and right-sided hemiparesis.  He recovered swallowing function.  He is able to move himself around the facility in a wheelchair and I think is mostly able to get himself to the bathroom.  He has right upper extremity spasticity.     There really have been no complaints that I am aware of.  No recent medication changes.    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    Vitamin D deficiency.    Essential hypertension.    Gastroesophageal reflux disease.    Anemia.    History of intracerebral hemorrhage, as described.    PAST SURGICAL HISTORY:            PEG tube placement, which has since been removed.    SOCIAL HISTORY:        ADVANCED DIRECTIVES:  The patient is a DNR.           CURRENT MEDICATIONS:  Medication list is reviewed.    REVIEW OF SYSTEMS:   Not possible from the patient.  However, his weight is 124 pounds which has largely been stable for over a year.    PHYSICAL EXAMINATION:   VITAL SIGNS:     TEMPERATURE:  98.1.    PULSE:  73.   RESPIRATIONS:  20.   BLOOD PRESSURE:  112/75.   WEIGHT:  124 pounds.    CHEST/RESPIRATORY:  Clear air entry bilaterally.    CARDIOVASCULAR:   CARDIAC:  Heart sounds are normal.   GASTROINTESTINAL:   ABDOMEN:  Soft.     LIVER/SPLEEN/KIDNEYS:  No liver, no spleen.  No tenderness.     GENITOURINARY:   BLADDER:  Not distended.  There is no CVA tenderness.    ASSESSMENT/PLAN:                 Late-effect dominant hemisphere CVA.  He is aphasic, with right upper greater than right lower extremity paralysis although he is still quite functional in the facility.  He can push himself around in a wheelchair, get himself to the bathroom.    Hypertension.  This is well controlled.    Microcytic, hypochromic anemia.  This was last checked in April of this year showing a hemoglobin of 10.2.  This has been stable.  This may represent a thalassemia trait.  I think this is unlikely to be iron deficiency.

## 2015-09-07 ENCOUNTER — Non-Acute Institutional Stay (SKILLED_NURSING_FACILITY): Payer: Medicare Other | Admitting: Internal Medicine

## 2015-09-07 ENCOUNTER — Encounter: Payer: Self-pay | Admitting: Internal Medicine

## 2015-09-07 DIAGNOSIS — I69959 Hemiplegia and hemiparesis following unspecified cerebrovascular disease affecting unspecified side: Secondary | ICD-10-CM | POA: Diagnosis not present

## 2015-09-07 DIAGNOSIS — R1314 Dysphagia, pharyngoesophageal phase: Secondary | ICD-10-CM

## 2015-09-07 DIAGNOSIS — I10 Essential (primary) hypertension: Secondary | ICD-10-CM | POA: Diagnosis not present

## 2015-09-07 DIAGNOSIS — D649 Anemia, unspecified: Secondary | ICD-10-CM

## 2015-09-07 NOTE — Progress Notes (Signed)
Patient ID: Philip [name], male   DOB: [birth year], 65 y.o.   MRN: [id number]  Location:  [facility] Room Number: 110A Place of Service:  SNF (31) Provider: Kimber Relic, MD  Patient Care Team: Kimber Relic, MD as PCP - General (Internal Medicine)  Extended Emergency Contact Information Primary Emergency Contact: Kyung Bacca,  Home Phone: [phone] Relation: None  Code Status:  DO NOT RESUSCITATE   Goals of care: Advanced Directive information Advanced Directives 09/07/2015  Does patient have an advance directive? Yes  Type of Advance Directive Out of facility DNR (pink MOST or yellow form)  Copy of advanced directive(s) in chart? Yes  Pre-existing out of facility DNR order (yellow form or pink MOST form) Yellow form placed in chart (order not valid for inpatient use)     Chief Complaint  Patient presents with  . Medical Management of Chronic Issues    HPI:  Pt is a 65 y.o. male seen today for medical management of chronic diseases.    Patient has previous stroke history. As result of this he has a right hemiplegia and dysphagia. There is some discomfort with swallowing.  Other problems include hypothyroidism, anemia, hypertension, hyperlipidemia.   Last hemoglobin from 02/18/2016 was 10.2. Serum ferritin 09/27/2014 209  Past Medical History  Diagnosis Date  . CVA (cerebral vascular accident) (HCC)   . Hemiplegia affecting dominant side, late effect of cerebrovascular disease   . Vitamin D deficiency   . Essential hypertension, benign   . GERD (gastroesophageal reflux disease)   . Anemia   . Thyroid disease   . Pneumonia   . DYSPHAGIA 07/09/2008    Qualifier: Diagnosis of  By: Christella Hartigan MD, Melton Alar   . Intracerebral hemorrhage (HCC)   . Risk for falls   . Hemiplegia and hemiparesis following cerebral infarction affecting right dominant side (HCC)   . Essential hypertension, malignant    Past Surgical History    Procedure Laterality Date  . Peg placement      No Known Allergies    Medication List       This list is accurate as of: 09/07/15 12:00 PM.  Always use your most recent med list.               acetaminophen 500 MG tablet  Commonly known as:  TYLENOL  Take 1,000 mg by mouth 2 (two) times daily.     baclofen 10 MG tablet  Commonly known as:  LIORESAL  Take 10 mg by mouth 3 (three) times daily.     benazepril 10 MG tablet  Commonly known as:  LOTENSIN  Take 10 mg by mouth daily.     cholecalciferol 1000 units tablet  Commonly known as:  VITAMIN D  Take 2,000 Units by mouth daily.     labetalol 100 MG tablet  Commonly known as:  NORMODYNE  Take 100 mg by mouth daily.        Review of Systems  Constitutional: Negative for fever, activity change, appetite change, fatigue and unexpected weight change.  HENT: Negative for congestion, ear pain, hearing loss, rhinorrhea, sore throat, tinnitus, trouble swallowing and voice change.   Eyes:       Corrective lenses  Respiratory: Negative for cough, choking, chest tightness, shortness of breath and wheezing.   Cardiovascular: Negative for chest pain, palpitations and leg swelling.       History atrial fibrillation and hypertension  Gastrointestinal: Negative for nausea, abdominal pain, diarrhea, constipation and abdominal distention.       Dysphagia  Endocrine: Negative for cold intolerance, heat intolerance, polydipsia, polyphagia and polyuria.       History of thyroid disorder without further the findings.  Genitourinary: Negative for dysuria, urgency, frequency and testicular pain.       Not incontinent  Musculoskeletal: Negative for myalgias, back pain, arthralgias, gait problem and neck pain.  Skin: Negative for color change, pallor and rash.  Allergic/Immunologic: Negative.   Neurological: Negative for dizziness, tremors, syncope, speech difficulty, weakness, numbness and headaches.       Status post CVA with right  hemiplegia and dysphagia  Hematological: Negative for adenopathy. Does not bruise/bleed easily.       History of anemia  Psychiatric/Behavioral: Negative for hallucinations, behavioral problems, confusion, sleep disturbance and decreased concentration. The patient is not nervous/anxious.     Immunization History  Administered Date(s) Administered  . Influenza-Unspecified 04/16/2013, 03/23/2015  . Pneumococcal-Unspecified 11/30/2007   Pertinent  Health Maintenance Due  Topic Date Due  . COLONOSCOPY  12/02/2001  . INFLUENZA VACCINE  01/19/2016   No flowsheet data found. Functional Status Survey:    There were no vitals filed for this visit. There is no weight on file to calculate BMI. Physical Exam  Constitutional: He is oriented to person, place, and time. He appears well-developed and well-nourished. No distress.  HENT:  Right Ear: External ear normal.  Left Ear: External ear normal.  Nose: Nose normal.  Mouth/Throat: Oropharynx is clear and moist. No oropharyngeal exudate.  Eyes: Conjunctivae and EOM are normal. Pupils are equal, round, and reactive to light.  Neck: No JVD present. No tracheal deviation present. No thyromegaly present.  Cardiovascular: Normal rate, regular rhythm, normal heart sounds and intact distal pulses.  Exam reveals no gallop and no friction rub.   No murmur heard. Irregular heart rhythm consistent with atrial fibrillation  Pulmonary/Chest: No respiratory distress. He has no wheezes. He has no rales. He exhibits no tenderness.  Abdominal: He exhibits no distension and no mass. There is no tenderness.  Musculoskeletal: Normal range of motion. He exhibits no edema or tenderness.  Right hemiparesis. Wears a right wrist and right leg brace.  Lymphadenopathy:    He has no cervical adenopathy.  Neurological: He is alert and oriented to person, place, and time. He has normal reflexes. No cranial nerve deficit. Coordination normal.  Skin: No rash noted. No  erythema. No pallor.  Tattoo of a woman's head of the left forearm  Psychiatric: He has a normal mood and affect. His behavior is normal. Judgment and thought content normal.    Labs reviewed: No results for input(s): NA, K, CL, CO2, GLUCOSE, BUN, CREATININE, CALCIUM, MG, PHOS in the last 8760 hours. No results for input(s): AST, ALT, ALKPHOS, BILITOT, PROT, ALBUMIN in the last 8760 hours. No results for input(s): WBC, NEUTROABS, HGB, HCT, MCV, PLT in the last 8760 hours. No results found for: TSH Lab Results  Component Value Date   HGBA1C  11/14/2007    (NOTE) Test Procedure               Results       Units       Normal Range Hemoglobin A1c w Mean Plasm Hemoglobin A1C               TNP           %  4.6-6.1  (performed at MAIN) Unable to determine HgbA1C using standard method due to  interference of probable abnormal hemoglobin. Suggest Glycohemoglobin, Total (14782).    Lab Results  Component Value Date   CHOL  11/14/2007    108        ATP III CLASSIFICATION:  <200     mg/dL   Desirable  956-213  mg/dL   Borderline High  >=086    mg/dL   High   HDL 43 57/84/6962   LDLCALC  11/14/2007    55        Total Cholesterol/HDL:CHD Risk Coronary Heart Disease Risk Table                     Men   Women  1/2 Average Risk   3.4   3.3   TRIG 49 11/14/2007   CHOLHDL 2.5 11/14/2007   07/22/2015 BUN 12, creatinine 0.9   TSH has not been checked recently  Assessment/Plan 1. Hemiplegia of dominant side as late effect following cerebrovascular disease (HCC) Chronic condition, unlikely to improve  2. Dysphagia, pharyngoesophageal phase Chronic. At risk for aspiration  3. Essential hypertension, benign Controlled  4. Anemia, unspecified anemia type Follow-up CBC    Labs/tests ordered:  TSH, CBC

## 2015-09-21 ENCOUNTER — Encounter: Payer: Self-pay | Admitting: Internal Medicine

## 2015-09-21 NOTE — Progress Notes (Signed)
This encounter was created in error - please disregard.

## 2017-01-26 ENCOUNTER — Telehealth: Payer: Self-pay | Admitting: Hematology and Oncology

## 2017-01-26 NOTE — Telephone Encounter (Signed)
[facility] to schedule a hematology appt for the pt. Appt has been scheduled for the pt to see Dr. Gweneth DimitriPerlov on 8/17 at 11am. Aware to have the pt arrive 30 minutes early.

## 2017-02-02 ENCOUNTER — Telehealth: Payer: Self-pay | Admitting: Hematology and Oncology

## 2017-02-02 NOTE — Telephone Encounter (Signed)
Made multiple attempts to obtain labs and proper referral from [facility], no reponse. I left a vm on 8/15 stating if I had not heard or received anything by close of business on 8/16, the pt's appt for 8/17 would canceled. I have not received a call or vm on 8/16. Appt canceled.

## 2017-02-03 ENCOUNTER — Other Ambulatory Visit (HOSPITAL_BASED_OUTPATIENT_CLINIC_OR_DEPARTMENT_OTHER): Payer: Medicare Other

## 2017-02-03 ENCOUNTER — Encounter: Payer: Medicare Other | Admitting: Hematology and Oncology

## 2017-02-03 ENCOUNTER — Telehealth: Payer: Self-pay | Admitting: Hematology and Oncology

## 2017-02-03 ENCOUNTER — Ambulatory Visit (HOSPITAL_BASED_OUTPATIENT_CLINIC_OR_DEPARTMENT_OTHER): Payer: Medicare Other | Admitting: Hematology and Oncology

## 2017-02-03 VITALS — BP 188/99 | HR 89 | Temp 98.6°F | Resp 17 | Ht 66.0 in

## 2017-02-03 DIAGNOSIS — Z8673 Personal history of transient ischemic attack (TIA), and cerebral infarction without residual deficits: Secondary | ICD-10-CM

## 2017-02-03 DIAGNOSIS — K219 Gastro-esophageal reflux disease without esophagitis: Secondary | ICD-10-CM | POA: Diagnosis not present

## 2017-02-03 DIAGNOSIS — I10 Essential (primary) hypertension: Secondary | ICD-10-CM | POA: Diagnosis not present

## 2017-02-03 DIAGNOSIS — D509 Iron deficiency anemia, unspecified: Secondary | ICD-10-CM

## 2017-02-03 DIAGNOSIS — I4891 Unspecified atrial fibrillation: Secondary | ICD-10-CM | POA: Diagnosis not present

## 2017-02-03 DIAGNOSIS — N189 Chronic kidney disease, unspecified: Secondary | ICD-10-CM

## 2017-02-03 LAB — CBC & DIFF AND RETIC
BASO%: 0.3 % (ref 0.0–2.0)
BASOS ABS: 0 10*3/uL (ref 0.0–0.1)
EOS%: 0.8 % (ref 0.0–7.0)
Eosinophils Absolute: 0.1 10*3/uL (ref 0.0–0.5)
HCT: 33.8 % — ABNORMAL LOW (ref 38.4–49.9)
HGB: 11.6 g/dL — ABNORMAL LOW (ref 13.0–17.1)
IMMATURE RETIC FRACT: 11.8 % — AB (ref 3.00–10.60)
LYMPH#: 0.8 10*3/uL — AB (ref 0.9–3.3)
LYMPH%: 7.6 % — ABNORMAL LOW (ref 14.0–49.0)
MCH: 20.1 pg — ABNORMAL LOW (ref 27.2–33.4)
MCHC: 34.3 g/dL (ref 32.0–36.0)
MCV: 58.7 fL — ABNORMAL LOW (ref 79.3–98.0)
MONO#: 0.7 10*3/uL (ref 0.1–0.9)
MONO%: 7 % (ref 0.0–14.0)
NEUT#: 8.6 10*3/uL — ABNORMAL HIGH (ref 1.5–6.5)
NEUT%: 84.3 % — AB (ref 39.0–75.0)
Platelets: 242 10*3/uL (ref 140–400)
RBC: 5.76 10*6/uL (ref 4.20–5.82)
RDW: 16.7 % — AB (ref 11.0–14.6)
RETIC %: 1.86 % — AB (ref 0.80–1.80)
RETIC CT ABS: 107.14 10*3/uL — AB (ref 34.80–93.90)
WBC: 10.2 10*3/uL (ref 4.0–10.3)

## 2017-02-03 LAB — MORPHOLOGY: PLT EST: 220

## 2017-02-03 LAB — COMPREHENSIVE METABOLIC PANEL
ALBUMIN: 4.1 g/dL (ref 3.5–5.0)
ALK PHOS: 110 U/L (ref 40–150)
ALT: 19 U/L (ref 0–55)
ANION GAP: 9 meq/L (ref 3–11)
AST: 17 U/L (ref 5–34)
BILIRUBIN TOTAL: 0.94 mg/dL (ref 0.20–1.20)
BUN: 11.4 mg/dL (ref 7.0–26.0)
CALCIUM: 9.7 mg/dL (ref 8.4–10.4)
CO2: 29 mEq/L (ref 22–29)
CREATININE: 0.9 mg/dL (ref 0.7–1.3)
Chloride: 103 mEq/L (ref 98–109)
EGFR: 87 mL/min/{1.73_m2} — AB (ref >90)
Glucose: 102 mg/dl (ref 70–140)
Potassium: 3.9 mEq/L (ref 3.5–5.1)
Sodium: 141 mEq/L (ref 136–145)
TOTAL PROTEIN: 8.5 g/dL — AB (ref 6.4–8.3)

## 2017-02-03 LAB — FERRITIN: FERRITIN: 348 ng/mL — AB (ref 22–316)

## 2017-02-03 LAB — IRON AND TIBC
%SAT: 18 % — AB (ref 20–55)
Iron: 48 ug/dL (ref 42–163)
TIBC: 269 ug/dL (ref 202–409)
UIBC: 221 ug/dL (ref 117–376)

## 2017-02-03 LAB — LACTATE DEHYDROGENASE: LDH: 153 U/L (ref 125–245)

## 2017-02-03 NOTE — Telephone Encounter (Signed)
Scheduled appt per 8/17 los - Gave patient AVS and calender per los  

## 2017-02-03 NOTE — Assessment & Plan Note (Signed)
66 year old male with multiple comorbidities including chronic renal disease, GERD, atrial fibrillation, hypertension history of severe stroke resulting in persistent right hemiparesis. Referred for persistent microcytic anemia, I assume.  Anemia has been present as far as the records goal which is about 9 years, in the past 2 years, the anemia has been mild to severe microcytosis and hypochromia. Iron numbers appear to have been very low in March/April 2017 while ferritin was exceeding 100. He patient was started on iron supplementation which resulted in stable anemia, improving iron numbers and ferritin but no significant change in the hemoglobin or the MCV/MCH values.  At this time, differential for persistent microcytosis with iron replete patient include congenital abnormalities of hemoglobin such as presence of hemoglobin H or hemoglobin E. It also may be attributed to deficiencies in other iron such as Copper or excessive zinc levels. Inflammatory suppression of the bone marrow production of red blood cells may also be a factor.  Plan: --Labs today as outlined below --Return to clinic in 3 weeks, preferably with some of his family members in attendance to review results of the current blood work

## 2017-02-03 NOTE — Progress Notes (Signed)
[facility] New Visit:  Assessment: Microcytic hypochromic anemia 66 year old male with multiple comorbidities including chronic renal disease, GERD, atrial fibrillation, hypertension history of severe stroke resulting in persistent right hemiparesis. Referred for persistent microcytic anemia, I assume.  Anemia has been present as far as the records goal which is about 9 years, in the past 2 years, the anemia has been mild to severe microcytosis and hypochromia. Iron numbers appear to have been very low in March/April 2017 while ferritin was exceeding 100. He patient was started on iron supplementation which resulted in stable anemia, improving iron numbers and ferritin but no significant change in the hemoglobin or the MCV/MCH values.  At this time, differential for persistent microcytosis with iron replete patient include congenital abnormalities of hemoglobin such as presence of hemoglobin H or hemoglobin E. It also may be attributed to deficiencies in other iron such as Copper or excessive zinc levels. Inflammatory suppression of the bone marrow production of red blood cells may also be a factor.  Plan: --Labs today as outlined below --Return to clinic in 3 weeks, preferably with some of his family members in attendance to review results of the current blood work  Armed forces training and education officer was used and Agricultural engineer of this note. Despite my best effort at editing the text, some misspelling/errors may have occurred.  Orders Placed This Encounter  Procedures  . CBC & Diff and Retic    Standing Status:   Future    Number of Occurrences:   1    Standing Expiration Date:   02/03/2018  . Morphology    Standing Status:   Future    Number of Occurrences:   1    Standing Expiration Date:   02/03/2018  . Comprehensive metabolic panel    Standing Status:   Future    Number of Occurrences:   1    Standing Expiration Date:   02/03/2018  . Lactate dehydrogenase (LDH)    Standing  Status:   Future    Number of Occurrences:   1    Standing Expiration Date:   02/03/2018  . Ferritin    Standing Status:   Future    Number of Occurrences:   1    Standing Expiration Date:   02/03/2018  . Iron and TIBC    Standing Status:   Future    Number of Occurrences:   1    Standing Expiration Date:   02/03/2018  . Sedimentation rate    Standing Status:   Future    Number of Occurrences:   1    Standing Expiration Date:   02/03/2018  . C-reactive protein    Standing Status:   Future    Number of Occurrences:   1    Standing Expiration Date:   02/03/2018  . Copper, serum    Standing Status:   Future    Number of Occurrences:   1    Standing Expiration Date:   02/03/2018  . Ceruloplasmin    Standing Status:   Future    Number of Occurrences:   1    Standing Expiration Date:   02/03/2018  . Zinc    Standing Status:   Future    Number of Occurrences:   1    Standing Expiration Date:   02/03/2018  . Hemoglobinopathy evaluation    Standing Status:   Future    Number of Occurrences:   1    Standing Expiration Date:   02/03/2018  All questions were answered.  . The patient knows to call the clinic with any problems, questions or concerns.  This note was electronically signed.    History of Presenting Illness Philip [name] is a 66 y.o. [place] male presenting to the [facility] for Evaluation of chronic microcytic hypochromic anemia. The provided documents to not clarify why the referral is made at this point in time, nor do they offer any additional questions for assessment. Please see hematological profile trends below for details. At this time, patient does not express any significant complaints. He does not appear to be able to answer questions in a consistent manner although he does comply to a degree with physical examination. He is residing at a skilled nursing facility due to severe neurological deficits that have developed subsequent to hemorrhagic stroke which has  resulted in dense right hemiparesis. Additional medical history includes hypertension, atrial fibrillation, chronic kidney disease, and GERD.  Oncological/hematological History: --Labs, 11/27/07: WBC 11.3, Hgb 10.0, MCV 63.2, MCH ..., RDW 16.4, Plt 509;  --Labs, 09/07/15: WBC 7.3, Hgb 10.9, MCV 59.2, MCH 19.4, RDW 16.3, Plt 277; TSH 0.36 --Labs, 10/14/15: Fe <10, FeSat 2%, TIBC 460, Ferritin 186, Vit B12 942 --Labs, 12/09/15: WBC 5.3, Hgb 10.2, MCV 58.6, MCH 19.1, RDW 16.5, Plt 259; --Labs, 05/27/16: WBC 8.6, Hgb 10.0, MCV 57.7, MCH 19.7, RDW 16.5, Plt 234; --Labs, 07/25/16: Fe 72, FeSat 25%, TIBC 283, Ferritin 293, RBC Folate 721; Vit D-25-OH 51.5 --Labs, 09/28/16: WBC 5.4, Hgb 10.4, MCV 58.1, MCH 19.4, RDW 16.8, Plt 257; --Labs, 12/30/16: TSH 0.85, Testosterone 330;   Medical History: Past Medical History:  Diagnosis Date  . Anemia   . CVA (cerebral vascular accident) ([place])   . DYSPHAGIA 07/09/2008   Qualifier: Diagnosis of  By: Ardis Hughs MD, Melene Plan   . Essential hypertension, benign   . Essential hypertension, malignant   . GERD (gastroesophageal reflux disease)   . Hemiplegia affecting dominant side, late effect of cerebrovascular disease   . Hemiplegia and hemiparesis following cerebral infarction affecting right dominant side ([place])   . Intracerebral hemorrhage ([place])   . Pneumonia   . Risk for falls   . Thyroid disease   . Vitamin D deficiency     Surgical History: Past Surgical History:  Procedure Laterality Date  . PEG PLACEMENT      Family History: No family history on file.  Social History: Social History   Social History  . Marital status: Single    Spouse name: N/A  . Number of children: N/A  . Years of education: N/A   Occupational History  . groundkeeper    Social History Main Topics  . Smoking status: Unknown If Ever Smoked  . Smokeless tobacco: Not on file  . Alcohol use Not on file  . Drug use: Unknown  . Sexual activity: Not on file   Other  Topics Concern  . Not on file   Social History Narrative   Lives at [facility]   Married    Birth Place [place]    DNR    Allergies: No Known Allergies  Medications:  Current Outpatient Prescriptions  Medication Sig Dispense Refill  . acetaminophen (TYLENOL) 500 MG tablet Take 1,000 mg by mouth 2 (two) times daily.    Marland Kitchen amLODipine (NORVASC) 10 MG tablet Take 10 mg by mouth daily.    Marland Kitchen aspirin 81 MG tablet Take 81 mg by mouth daily.    . baclofen (LIORESAL) 10 MG tablet Take 10 mg by mouth 3 (  three) times daily.     . benazepril (LOTENSIN) 10 MG tablet Take 10 mg by mouth daily.    . cholecalciferol (VITAMIN D) 1000 UNITS tablet Take 2,000 Units by mouth daily.    . clopidogrel (PLAVIX) 75 MG tablet Take 75 mg by mouth daily.    . isosorbide mononitrate (IMDUR) 60 MG 24 hr tablet Take 60 mg by mouth daily. Take one tablet daily for CAD    . labetalol (NORMODYNE) 100 MG tablet Take 100 mg by mouth daily.    . Multiple Vitamins-Minerals (CERTAGEN PO) Take by mouth. Take one tablet daily for vitamin    . omeprazole (PRILOSEC) 20 MG capsule Take 20 mg by mouth daily.     No current facility-administered medications for this visit.     Review of Systems: Review of Systems  Unable to perform ROS: Patient nonverbal     PHYSICAL EXAMINATION Blood pressure (!) 188/99, pulse 89, temperature 98.6 F (37 C), temperature source Oral, resp. rate 17, height '5\' 6"'  (1.676 m), SpO2 98 %.  ECOG PERFORMANCE STATUS: 3 - Symptomatic, >50% confined to bed  Physical Exam  Constitutional:  Thin middle-aged male, sitting comfortably in the wheelchair, unable to establish whether he is oriented as the patient is nonverbal.  HENT:  Head: Normocephalic and atraumatic.  Mouth/Throat: No oropharyngeal exudate.  Eyes: Pupils are equal, round, and reactive to light. EOM are normal. No scleral icterus.  Neck: No thyromegaly present.  Cardiovascular: Normal rate and regular rhythm.   No murmur  heard. Pulmonary/Chest: Effort normal and breath sounds normal. No respiratory distress. He has no wheezes. He has no rales.  Abdominal: Soft. He exhibits no distension and no mass. There is no tenderness. There is no rebound and no guarding.  Musculoskeletal: He exhibits no edema.  Neurological:  Dense right-sided hemiparesis. Patient has minimal encounter movements on the affected side. Appears to be moving the left side without difficulties.  Skin: He is not diaphoretic.     LABORATORY DATA: I have personally reviewed the data as listed: Appointment on 02/03/2017  Component Date Value Ref Range Status  . WBC 02/03/2017 10.2  4.0 - 10.3 10e3/uL Final  . NEUT# 02/03/2017 8.6* 1.5 - 6.5 10e3/uL Final  . HGB 02/03/2017 11.6* 13.0 - 17.1 g/dL Final  . HCT 02/03/2017 33.8* 38.4 - 49.9 % Final  . Platelets 02/03/2017 242  140 - 400 10e3/uL Final  . MCV 02/03/2017 58.7* 79.3 - 98.0 fL Final  . MCH 02/03/2017 20.1* 27.2 - 33.4 pg Final  . MCHC 02/03/2017 34.3  32.0 - 36.0 g/dL Final  . RBC 02/03/2017 5.76  4.20 - 5.82 10e6/uL Final  . RDW 02/03/2017 16.7* 11.0 - 14.6 % Final  . lymph# 02/03/2017 0.8* 0.9 - 3.3 10e3/uL Final  . MONO# 02/03/2017 0.7  0.1 - 0.9 10e3/uL Final  . Eosinophils Absolute 02/03/2017 0.1  0.0 - 0.5 10e3/uL Final  . Basophils Absolute 02/03/2017 0.0  0.0 - 0.1 10e3/uL Final  . NEUT% 02/03/2017 84.3* 39.0 - 75.0 % Final  . LYMPH% 02/03/2017 7.6* 14.0 - 49.0 % Final  . MONO% 02/03/2017 7.0  0.0 - 14.0 % Final  . EOS% 02/03/2017 0.8  0.0 - 7.0 % Final  . BASO% 02/03/2017 0.3  0.0 - 2.0 % Final  . Retic % 02/03/2017 1.86* 0.80 - 1.80 % Final  . Retic Ct Abs 02/03/2017 107.14* 34.80 - 93.90 10e3/uL Final  . Immature Retic Fract 02/03/2017 11.80* 3.00 - 10.60 % Final  .  Tear Drop Cells 02/03/2017 slight  Negative Final  . Shistocytes 02/03/2017 sight  Negative Final  . Target Cells 02/03/2017 Moderate  Negative Final  . RBC Comments 02/03/2017 few stomatocytes  Within  Normal Limits Final  . White Cell Comments 02/03/2017 C/W auto diff   Final  . PLT EST 02/03/2017 220  Adequate Final  . Platelet Morphology 02/03/2017 few large plts   Within Normal Limits Final  . Sodium 02/03/2017 141  136 - 145 mEq/L Final  . Potassium 02/03/2017 3.9  3.5 - 5.1 mEq/L Final  . Chloride 02/03/2017 103  98 - 109 mEq/L Final  . CO2 02/03/2017 29  22 - 29 mEq/L Final  . Glucose 02/03/2017 102  70 - 140 mg/dl Final   Glucose reference range is for nonfasting patients. Fasting glucose reference range is 70- 100.  Marland Kitchen BUN 02/03/2017 11.4  7.0 - 26.0 mg/dL Final  . Creatinine 02/03/2017 0.9  0.7 - 1.3 mg/dL Final  . Total Bilirubin 02/03/2017 0.94  0.20 - 1.20 mg/dL Final  . Alkaline Phosphatase 02/03/2017 110  40 - 150 U/L Final  . AST 02/03/2017 17  5 - 34 U/L Final  . ALT 02/03/2017 19  0 - 55 U/L Final  . Total Protein 02/03/2017 8.5* 6.4 - 8.3 g/dL Final  . Albumin 02/03/2017 4.1  3.5 - 5.0 g/dL Final  . Calcium 02/03/2017 9.7  8.4 - 10.4 mg/dL Final  . Anion Gap 02/03/2017 9  3 - 11 mEq/L Final  . EGFR 02/03/2017 87* >90 ml/min/1.73 m2 Final   eGFR is calculated using the CKD-EPI Creatinine Equation (2009)  . LDH 02/03/2017 153  125 - 245 U/L Final  . Ferritin 02/03/2017 348* 22 - 316 ng/ml Final  . Iron 02/03/2017 48  42 - 163 ug/dL Final  . TIBC 02/03/2017 269  202 - 409 ug/dL Final  . UIBC 02/03/2017 221  117 - 376 ug/dL Final  . %SAT 02/03/2017 18* 20 - 55 % Final        Ardath Sax, MD

## 2017-02-04 LAB — ZINC: Zinc, Plasma or Serum: 63 ug/dL (ref 56–134)

## 2017-02-04 LAB — CERULOPLASMIN: Ceruloplasmin: 21.6 mg/dL (ref 16.0–31.0)

## 2017-02-04 LAB — SEDIMENTATION RATE: Sedimentation Rate-Westergren: 23 mm/h (ref 0–30)

## 2017-02-04 LAB — C-REACTIVE PROTEIN: CRP: 2.1 mg/L (ref 0.0–4.9)

## 2017-02-04 LAB — COPPER, SERUM: Copper: 99 ug/dL (ref 72–166)

## 2017-02-07 LAB — HEMOGLOBINOPATHY EVALUATION
HEMOGLOBIN A2 QUANTITATION: 6.3 % — AB (ref 1.8–3.2)
HGB C: 0 %
HGB S: 0 %
HGB VARIANT: 93.7 % — AB
Hemoglobin F Quantitation: 0 % (ref 0.0–2.0)
Hgb A: 0 % — ABNORMAL LOW (ref 96.4–98.8)

## 2017-02-24 ENCOUNTER — Ambulatory Visit: Payer: Medicare Other | Admitting: Hematology and Oncology

## 2017-03-01 ENCOUNTER — Encounter: Payer: Self-pay | Admitting: Physician Assistant

## 2017-03-13 ENCOUNTER — Encounter: Payer: Self-pay | Admitting: Physician Assistant

## 2017-03-13 ENCOUNTER — Encounter (INDEPENDENT_AMBULATORY_CARE_PROVIDER_SITE_OTHER): Payer: Self-pay

## 2017-03-13 ENCOUNTER — Ambulatory Visit (INDEPENDENT_AMBULATORY_CARE_PROVIDER_SITE_OTHER): Payer: Medicare Other | Admitting: Physician Assistant

## 2017-03-13 VITALS — BP 156/80 | HR 76

## 2017-03-13 DIAGNOSIS — R1310 Dysphagia, unspecified: Secondary | ICD-10-CM

## 2017-03-13 DIAGNOSIS — D509 Iron deficiency anemia, unspecified: Secondary | ICD-10-CM | POA: Diagnosis not present

## 2017-03-13 NOTE — Progress Notes (Signed)
Chief Complaint: Dysphagia  HPI:  Philip [name] is a 66 year old [place] male with a past medical history of CVA and aphasia, as well as multiple others listed below, who presents to clinic today accompanied by a nurse from his nursing facility as well as an interpreter for a complaint of dysphagia   Today, it should be noted the interpreter is unable to understand the patient due to his Aphasia. Overall history is collected from the nurse in the room who explains over the past 3 months the patient has been pointing at his neck and complaining of something when eating solid foods. They believe things may be getting stuck. They deny episodes of regurgitation, eventually the food goes down. He has not lost weight over this period time. The patient's POA is not in the room with him, as he was unaware of the patient's appointment.   Patient's past medical history is positive for IDA being worked up by hematology. Patient has never had a colonoscopy.   Patient denies abdominal pain.  Past Medical History:  Diagnosis Date  . Anemia   . CVA (cerebral vascular accident) ([place])   . DYSPHAGIA 07/09/2008   Qualifier: Diagnosis of  By: Ardis Hughs MD, Melene Plan   . Essential hypertension, benign   . Essential hypertension, malignant   . GERD (gastroesophageal reflux disease)   . Hemiplegia affecting dominant side, late effect of cerebrovascular disease   . Hemiplegia and hemiparesis following cerebral infarction affecting right dominant side ([place])   . Intracerebral hemorrhage ([place])   . Iron deficiency anemia   . Pneumonia   . Risk for falls   . Thyroid disease   . Vitamin D deficiency     Past Surgical History:  Procedure Laterality Date  . PEG PLACEMENT      Current Outpatient Prescriptions  Medication Sig Dispense Refill  . acetaminophen (TYLENOL) 500 MG tablet Take 1,000 mg by mouth 2 (two) times daily.    Marland Kitchen atorvastatin (LIPITOR) 40 MG tablet Take 40 mg by mouth daily.    . baclofen (LIORESAL)  10 MG tablet Take 10 mg by mouth 3 (three) times daily.     . benazepril (LOTENSIN) 10 MG tablet Take 10 mg by mouth daily.    . cholecalciferol (VITAMIN D) 1000 UNITS tablet Take 2,000 Units by mouth daily.    . ferrous sulfate 325 (65 FE) MG tablet Take 325 mg by mouth 2 (two) times daily with a meal.    . labetalol (NORMODYNE) 100 MG tablet Take 100 mg by mouth daily.    . Multiple Vitamins-Minerals (CERTAGEN PO) Take by mouth. Take one tablet daily for vitamin    . omeprazole (PRILOSEC) 40 MG capsule Take 40 mg by mouth 2 (two) times daily.    . vitamin C (ASCORBIC ACID) 500 MG tablet Take 500 mg by mouth daily.    . clopidogrel (PLAVIX) 75 MG tablet Take 75 mg by mouth daily.    . isosorbide mononitrate (IMDUR) 60 MG 24 hr tablet Take 60 mg by mouth daily. Take one tablet daily for CAD     No current facility-administered medications for this visit.     Allergies as of 03/13/2017  . (No Known Allergies)    Family History  Problem Relation Age of Onset  . Family history unknown: Yes    Social History   Social History  . Marital status: Single    Spouse name: N/A  . Number of children: N/A  . Years of education: N/A  Occupational History  . groundkeeper    Social History Main Topics  . Smoking status: Unknown If Ever Smoked  . Smokeless tobacco: Not on file  . Alcohol use No  . Drug use: No  . Sexual activity: Not on file   Other Topics Concern  . Not on file   Social History Narrative   Lives at [facility]   Married    Birth Place [place]    DNR    Review of Systems:    Unable to complete due to aphasia and language barrier   Physical Exam:  Vital signs: BP (!) 156/80   Pulse 76    Constitutional:   Pleasant [place] male appears to be in NAD, Well developed, Well nourished, alert and cooperative Head:  Normocephalic and atraumatic. Eyes:   PEERL, EOMI. No icterus. Conjunctiva pink. Ears:  Normal auditory acuity. Neck:  Supple Throat: Oral cavity  and pharynx without inflammation, swelling or lesion.  Respiratory: Respirations even and unlabored. Lungs clear to auscultation bilaterally.   No wheezes, crackles, or rhonchi.  Cardiovascular: Normal S1, S2. No MRG. Regular rate and rhythm. No peripheral edema, cyanosis or pallor.  Gastrointestinal:  Soft, nondistended, nontender. No rebound or guarding. Normal bowel sounds. No appreciable masses or hepatomegaly. Rectal:  Not performed.  Msk:  Symmetrical without gross deformities. Without edema, no deformity or joint abnormality. Ambulates in wheelchair Neurologic:  Alert and  oriented Skin:   Dry and intact without significant lesions or rashes. Psychiatric: Aphasia  MOST RECENT LABS AND IMAGING: CBC    Component Value Date/Time   WBC 10.2 02/03/2017 1237   WBC 11.3 (H) 11/27/2007 1400   RBC 5.76 02/03/2017 1237   RBC 4.95 11/27/2007 1400   HGB 11.6 (L) 02/03/2017 1237   HCT 33.8 (L) 02/03/2017 1237   PLT 242 02/03/2017 1237   MCV 58.7 (L) 02/03/2017 1237   MCH 20.1 (L) 02/03/2017 1237   MCHC 34.3 02/03/2017 1237   MCHC 32.1 11/27/2007 1400   RDW 16.7 (H) 02/03/2017 1237   LYMPHSABS 0.8 (L) 02/03/2017 1237   MONOABS 0.7 02/03/2017 1237   EOSABS 0.1 02/03/2017 1237   BASOSABS 0.0 02/03/2017 1237    CMP     Component Value Date/Time   NA 141 02/03/2017 1237   K 3.9 02/03/2017 1237   CL 99 11/27/2007 1400   CO2 29 02/03/2017 1237   GLUCOSE 102 02/03/2017 1237   BUN 11.4 02/03/2017 1237   CREATININE 0.9 02/03/2017 1237   CALCIUM 9.7 02/03/2017 1237   PROT 8.5 (H) 02/03/2017 1237   ALBUMIN 4.1 02/03/2017 1237   AST 17 02/03/2017 1237   ALT 19 02/03/2017 1237   ALKPHOS 110 02/03/2017 1237   BILITOT 0.94 02/03/2017 1237   GFRNONAA >60 11/27/2007 1400   GFRAA  11/27/2007 1400    >60        The eGFR has been calculated using the MDRD equation. This calculation has not been validated in all clinical    Assessment: 1. Dysphagia: Per nursing facility, complains when  eating solid foods; consider esophageal stricture versus ring versus web versus mass 2. IDA: Per records, being followed by hematology 3. Screening for colorectal cancer  Plan: 1. At time of visit we cannot understand patient as he has aphasia. Interpreter cannot understand him either. Per nursing facility they believe he has dysphagia 2. Patient has never had a colonoscopy for colorectal cancer screening and also has a history of IDA being worked up by hematology 3. CMA spoke  with patient's POA who would like to be present at time of his next appointment so that we can ascertain what the next step should be. I would recommend that he have an EGD and colonoscopy at the hospital due to his wheelchair use and needing extra help. This would be scheduled with Dr. Ardis Hughs as he has seen the patient previously. 4. Rescheduled patient for return office visit to discuss above 5. Continue twice a day PPI 6. Also we are unsure if patient is currently on Plavix, it is on his medication list in the computer but not on the nursing facility lists, we will have to decipher this before time of procedures  Ellouise Newer, PA-C [facility] 03/13/2017, 11:15 AM  Cc: No ref. provider found

## 2017-03-14 NOTE — Progress Notes (Signed)
I agree with the above note, plan 

## 2017-04-17 ENCOUNTER — Ambulatory Visit (INDEPENDENT_AMBULATORY_CARE_PROVIDER_SITE_OTHER): Payer: Medicare Other | Admitting: Physician Assistant

## 2017-04-17 ENCOUNTER — Encounter: Payer: Self-pay | Admitting: Physician Assistant

## 2017-04-17 VITALS — BP 154/100 | HR 100 | Ht 66.0 in

## 2017-04-17 DIAGNOSIS — Z1211 Encounter for screening for malignant neoplasm of colon: Secondary | ICD-10-CM | POA: Diagnosis not present

## 2017-04-17 DIAGNOSIS — R1310 Dysphagia, unspecified: Secondary | ICD-10-CM | POA: Diagnosis not present

## 2017-04-17 DIAGNOSIS — D509 Iron deficiency anemia, unspecified: Secondary | ICD-10-CM | POA: Diagnosis not present

## 2017-04-17 MED ORDER — NA SULFATE-K SULFATE-MG SULF 17.5-3.13-1.6 GM/177ML PO SOLN: 1.0000 | ORAL | 0 refills | Status: DC

## 2017-04-17 NOTE — H&P (View-Only) (Signed)
Chief Complaint: Dysphagia, IDA  HPI:    [name] is a 66 year old [place] male with a past medical history of CVA and aphasia, as well as multiple others listed below, who presents to clinic today accompanied by his nephew who is also a language interpreter for follow-up after recent visit for dysphagia.    Recall patient was seen 03/13/17 with a nurse from his nursing facility and they described dysphagia symptoms at that time.  Patient is also being worked up for iron deficiency anemia by hematology and had never had a screening colonoscopy.  We were unable to understand the patient at that time it was recommended he come in with his nephew so that we can review recommendations of an EGD and colonoscopy with him, as he is patient's POA.    Today, the patient returns to clinic accompanied by his nephew.  His nephew continues to tell us that he cannot fully understand him.  He does note that sometimes he points to the area of his previous PEG tube incision/scar, but when asked about pain he does not really respond.  Patient's nephew is aware of troubles with swallowing for the patient in the past, though he tells me that per his recollection it does not seem as bad as it once was.  Though, he refers to the nurses because "they are with him all the time", and he respects their opinion.    Patient denies abdominal pain today.  Past Medical History:  Diagnosis Date  . Anemia   . CVA (cerebral vascular accident) ([place])   . DYSPHAGIA 07/09/2008   Qualifier: Diagnosis of  By: Ardis Hughs MD, Melene Plan   . Essential hypertension, benign   . Essential hypertension, malignant   . GERD (gastroesophageal reflux disease)   . Hemiplegia affecting dominant side, late effect of cerebrovascular disease   . Hemiplegia and hemiparesis following cerebral infarction affecting right dominant side ([place])   . Intracerebral hemorrhage ([place])   . Iron deficiency anemia   . Pneumonia   . Risk for falls   . Thyroid disease    . Vitamin D deficiency     Past Surgical History:  Procedure Laterality Date  . PEG PLACEMENT      Current Outpatient Prescriptions  Medication Sig Dispense Refill  . acetaminophen (TYLENOL) 500 MG tablet Take 1,000 mg by mouth 2 (two) times daily.    Marland Kitchen atorvastatin (LIPITOR) 40 MG tablet Take 40 mg by mouth daily.    . baclofen (LIORESAL) 10 MG tablet Take 10 mg by mouth 3 (three) times daily.     . benazepril (LOTENSIN) 10 MG tablet Take 10 mg by mouth daily.    . cholecalciferol (VITAMIN D) 1000 UNITS tablet Take 2,000 Units by mouth daily.    . ferrous sulfate 325 (65 FE) MG tablet Take 325 mg by mouth 2 (two) times daily with a meal.    . labetalol (NORMODYNE) 100 MG tablet Take 100 mg by mouth daily.    . Multiple Vitamins-Minerals (CERTAGEN PO) Take by mouth. Take one tablet daily for vitamin    . omeprazole (PRILOSEC) 40 MG capsule Take 40 mg by mouth 2 (two) times daily.    . vitamin C (ASCORBIC ACID) 500 MG tablet Take 500 mg by mouth daily.     No current facility-administered medications for this visit.     Allergies as of 04/17/2017  . (No Known Allergies)    Family History  Problem Relation Age of Onset  . Family  history unknown: Yes    Social History   Social History  . Marital status: Single    Spouse name: N/A  . Number of children: 0  . Years of education: N/A   Occupational History  . disabled    Social History Main Topics  . Smoking status: Unknown If Ever Smoked  . Smokeless tobacco: Never Used  . Alcohol use No  . Drug use: No  . Sexual activity: Not on file   Other Topics Concern  . Not on file   Social History Narrative   Lives at [place]   Married    Birth Place [place]    DNR    Review of Systems:    Unable to complete due to a aphasia and language barrier.   Physical Exam:  Vital signs: BP (!) 154/100   Pulse 100   Ht '5\' 6"'  (1.676 m)   Constitutional:   Pleasant [place] male appears to be in NAD, Well developed,  Well nourished, alert and cooperative Respiratory: Respirations even and unlabored. Lungs clear to auscultation bilaterally.   No wheezes, crackles, or rhonchi.  Cardiovascular: Normal S1, S2. No MRG. Regular rate and rhythm. No peripheral edema, cyanosis or pallor.  Gastrointestinal:  Soft, nondistended, nontender. No rebound or guarding. Normal bowel sounds. No appreciable masses or hepatomegaly.previous well healed PEG tube insertion scar left mid-abdomen Rectal:  Not performed.  MSK: ambulates in wheelchair, right arm contracture Psychiatric: Aphasia  MOST RECENT LABS AND IMAGING: CBC    Component Value Date/Time   WBC 10.2 02/03/2017 1237   WBC 11.3 (H) 11/27/2007 1400   RBC 5.76 02/03/2017 1237   RBC 4.95 11/27/2007 1400   HGB 11.6 (L) 02/03/2017 1237   HCT 33.8 (L) 02/03/2017 1237   PLT 242 02/03/2017 1237   MCV 58.7 (L) 02/03/2017 1237   MCH 20.1 (L) 02/03/2017 1237   MCHC 34.3 02/03/2017 1237   MCHC 32.1 11/27/2007 1400   RDW 16.7 (H) 02/03/2017 1237   LYMPHSABS 0.8 (L) 02/03/2017 1237   MONOABS 0.7 02/03/2017 1237   EOSABS 0.1 02/03/2017 1237   BASOSABS 0.0 02/03/2017 1237    CMP     Component Value Date/Time   NA 141 02/03/2017 1237   K 3.9 02/03/2017 1237   CL 99 11/27/2007 1400   CO2 29 02/03/2017 1237   GLUCOSE 102 02/03/2017 1237   BUN 11.4 02/03/2017 1237   CREATININE 0.9 02/03/2017 1237   CALCIUM 9.7 02/03/2017 1237   PROT 8.5 (H) 02/03/2017 1237   ALBUMIN 4.1 02/03/2017 1237   AST 17 02/03/2017 1237   ALT 19 02/03/2017 1237   ALKPHOS 110 02/03/2017 1237   BILITOT 0.94 02/03/2017 1237   GFRNONAA >60 11/27/2007 1400   GFRAA  11/27/2007 1400    >60        The eGFR has been calculated using the MDRD equation. This calculation has not been validated in all clinical    Assessment: 1.  Dysphasia: Per nursing facility, also nephew has noticed patient will "clutch his throat" after eating; concern for esophagitis versus esophageal stricture versus  ring versus web 2.  IDA: Again, being worked up by hematology, last iron studies 02/03/17 with percent saturation low at 18, ferritin high at 348, most recent hemoglobin 11.6 on that date 3.  Screening for colorectal cancer: Patient has never had screening colonoscopy  Plan: 1.  At time of visit again we cannot understand the patient, neither can his nephew.  I discuss recommendations for an EGD  and colonoscopy for dysphagia as well as screening and iron deficiency anemia.  His nephew agrees to sign for procedures, but would like to be aware of cost of these procedures before confirming. 2.  EGD and colonoscopy were scheduled in the hospital with Dr. Ardis Hughs at his next available day on 04/27/17.  Did review risks, benefits, limitations and alternatives with the patient's nephew, who is his POA, and he agrees to proceed. 3.  Patient to continue his Pantoprazole 40 mg twice a day 4.  Patient is not on Plavix per current MAR. 5.  Dr. Ardis Hughs will provide further recommendations after time of patient's procedures.  Ellouise Newer, PA-C [facility] 04/17/2017, 10:22 AM  Cc: No ref. provider found

## 2017-04-17 NOTE — Progress Notes (Signed)
Chief Complaint: Dysphagia, IDA  HPI:    [name] is a 66 year old [place] male with a past medical history of CVA and aphasia, as well as multiple others listed below, who presents to clinic today accompanied by his nephew who is also a language interpreter for follow-up after recent visit for dysphagia.    Recall patient was seen 03/13/17 with a nurse from his nursing facility and they described dysphagia symptoms at that time.  Patient is also being worked up for iron deficiency anemia by hematology and had never had a screening colonoscopy.  We were unable to understand the patient at that time it was recommended he come in with his nephew so that we can review recommendations of an EGD and colonoscopy with him, as he is patient's POA.    Today, the patient returns to clinic accompanied by his nephew.  His nephew continues to tell us that he cannot fully understand him.  He does note that sometimes he points to the area of his previous PEG tube incision/scar, but when asked about pain he does not really respond.  Patient's nephew is aware of troubles with swallowing for the patient in the past, though he tells me that per his recollection it does not seem as bad as it once was.  Though, he refers to the nurses because "they are with him all the time", and he respects their opinion.    Patient denies abdominal pain today.  Past Medical History:  Diagnosis Date  . Anemia   . CVA (cerebral vascular accident) ([place])   . DYSPHAGIA 07/09/2008   Qualifier: Diagnosis of  By: Ardis Hughs MD, Melene Plan   . Essential hypertension, benign   . Essential hypertension, malignant   . GERD (gastroesophageal reflux disease)   . Hemiplegia affecting dominant side, late effect of cerebrovascular disease   . Hemiplegia and hemiparesis following cerebral infarction affecting right dominant side ([place])   . Intracerebral hemorrhage ([place])   . Iron deficiency anemia   . Pneumonia   . Risk for falls   . Thyroid disease    . Vitamin D deficiency     Past Surgical History:  Procedure Laterality Date  . PEG PLACEMENT      Current Outpatient Prescriptions  Medication Sig Dispense Refill  . acetaminophen (TYLENOL) 500 MG tablet Take 1,000 mg by mouth 2 (two) times daily.    Marland Kitchen atorvastatin (LIPITOR) 40 MG tablet Take 40 mg by mouth daily.    . baclofen (LIORESAL) 10 MG tablet Take 10 mg by mouth 3 (three) times daily.     . benazepril (LOTENSIN) 10 MG tablet Take 10 mg by mouth daily.    . cholecalciferol (VITAMIN D) 1000 UNITS tablet Take 2,000 Units by mouth daily.    . ferrous sulfate 325 (65 FE) MG tablet Take 325 mg by mouth 2 (two) times daily with a meal.    . labetalol (NORMODYNE) 100 MG tablet Take 100 mg by mouth daily.    . Multiple Vitamins-Minerals (CERTAGEN PO) Take by mouth. Take one tablet daily for vitamin    . omeprazole (PRILOSEC) 40 MG capsule Take 40 mg by mouth 2 (two) times daily.    . vitamin C (ASCORBIC ACID) 500 MG tablet Take 500 mg by mouth daily.     No current facility-administered medications for this visit.     Allergies as of 04/17/2017  . (No Known Allergies)    Family History  Problem Relation Age of Onset  . Family  history unknown: Yes    Social History   Social History  . Marital status: Single    Spouse name: N/A  . Number of children: 0  . Years of education: N/A   Occupational History  . disabled    Social History Main Topics  . Smoking status: Unknown If Ever Smoked  . Smokeless tobacco: Never Used  . Alcohol use No  . Drug use: No  . Sexual activity: Not on file   Other Topics Concern  . Not on file   Social History Narrative   Lives at [place]   Married    Birth Place [place]    DNR    Review of Systems:    Unable to complete due to a aphasia and language barrier.   Physical Exam:  Vital signs: BP (!) 154/100   Pulse 100   Ht '5\' 6"'  (1.676 m)   Constitutional:   Pleasant [place] male appears to be in NAD, Well developed,  Well nourished, alert and cooperative Respiratory: Respirations even and unlabored. Lungs clear to auscultation bilaterally.   No wheezes, crackles, or rhonchi.  Cardiovascular: Normal S1, S2. No MRG. Regular rate and rhythm. No peripheral edema, cyanosis or pallor.  Gastrointestinal:  Soft, nondistended, nontender. No rebound or guarding. Normal bowel sounds. No appreciable masses or hepatomegaly.previous well healed PEG tube insertion scar left mid-abdomen Rectal:  Not performed.  MSK: ambulates in wheelchair, right arm contracture Psychiatric: Aphasia  MOST RECENT LABS AND IMAGING: CBC    Component Value Date/Time   WBC 10.2 02/03/2017 1237   WBC 11.3 (H) 11/27/2007 1400   RBC 5.76 02/03/2017 1237   RBC 4.95 11/27/2007 1400   HGB 11.6 (L) 02/03/2017 1237   HCT 33.8 (L) 02/03/2017 1237   PLT 242 02/03/2017 1237   MCV 58.7 (L) 02/03/2017 1237   MCH 20.1 (L) 02/03/2017 1237   MCHC 34.3 02/03/2017 1237   MCHC 32.1 11/27/2007 1400   RDW 16.7 (H) 02/03/2017 1237   LYMPHSABS 0.8 (L) 02/03/2017 1237   MONOABS 0.7 02/03/2017 1237   EOSABS 0.1 02/03/2017 1237   BASOSABS 0.0 02/03/2017 1237    CMP     Component Value Date/Time   NA 141 02/03/2017 1237   K 3.9 02/03/2017 1237   CL 99 11/27/2007 1400   CO2 29 02/03/2017 1237   GLUCOSE 102 02/03/2017 1237   BUN 11.4 02/03/2017 1237   CREATININE 0.9 02/03/2017 1237   CALCIUM 9.7 02/03/2017 1237   PROT 8.5 (H) 02/03/2017 1237   ALBUMIN 4.1 02/03/2017 1237   AST 17 02/03/2017 1237   ALT 19 02/03/2017 1237   ALKPHOS 110 02/03/2017 1237   BILITOT 0.94 02/03/2017 1237   GFRNONAA >60 11/27/2007 1400   GFRAA  11/27/2007 1400    >60        The eGFR has been calculated using the MDRD equation. This calculation has not been validated in all clinical    Assessment: 1.  Dysphasia: Per nursing facility, also nephew has noticed patient will "clutch his throat" after eating; concern for esophagitis versus esophageal stricture versus  ring versus web 2.  IDA: Again, being worked up by hematology, last iron studies 02/03/17 with percent saturation low at 18, ferritin high at 348, most recent hemoglobin 11.6 on that date 3.  Screening for colorectal cancer: Patient has never had screening colonoscopy  Plan: 1.  At time of visit again we cannot understand the patient, neither can his nephew.  I discuss recommendations for an EGD  and colonoscopy for dysphagia as well as screening and iron deficiency anemia.  His nephew agrees to sign for procedures, but would like to be aware of cost of these procedures before confirming. 2.  EGD and colonoscopy were scheduled in the hospital with Dr. Ardis Hughs at his next available day on 04/27/17.  Did review risks, benefits, limitations and alternatives with the patient's nephew, who is his POA, and he agrees to proceed. 3.  Patient to continue his Pantoprazole 40 mg twice a day 4.  Patient is not on Plavix per current MAR. 5.  Dr. Ardis Hughs will provide further recommendations after time of patient's procedures.  Ellouise Newer, PA-C [facility] 04/17/2017, 10:22 AM  Cc: No ref. provider found

## 2017-04-17 NOTE — Progress Notes (Signed)
I agree with the above note, plan 

## 2017-04-25 ENCOUNTER — Other Ambulatory Visit: Payer: Self-pay

## 2017-04-25 ENCOUNTER — Encounter (HOSPITAL_COMMUNITY): Payer: Self-pay | Admitting: *Deleted

## 2017-04-25 NOTE — Progress Notes (Signed)
Preop instructions for Krista BlueYDRIA Nuss  Date of Birth [birth year]                           Date of Procedure:  05-04-17      Doctor: Christella HartiganJACOBS [facility]  Report to: Admitting  Procedure: COLONSCOPY WITH PROPOFOL, ENDOSCOPY Any procedure time changes, MD office will notify you!   Do not eat or drink past midnight the night before your procedure.(To include any tube feedings-must be discontinued) NO MEDICATIONS TO BE GIVEN MORNING OF PROCEDURE. Reminder:Follow bowel prep instructions per MD office!   Take these morning medications only with sips of water.(or give through gastrostomy or feeding tube).NONE    Facility contact: [employer] Energy[employer]RISTY  NURSE   Phone:   [phone] Valinda HoarFAX [phone] Health Care GEX:BMWUXLPOA:NEPHEW [name] PHONE [phone]  Transportation contact phone#: FACILITY TO PROVIDE TRANSPORTATION  Please send day of procedure:current med list and meds last taken that day, confirm nothing by mouth status from what time, Patient Demographic info( to include DNR status, problem list, allergies)   Bring Insurance card and picture ID Leave all jewelry and other valuables at place where living( no metal or rings to be worn) No contact lens Women-no make-up, no lotions,perfumes,powders Men-no colognes,lotions  Any questions day of procedure,call Endoscopy [phone]!   Sent from : [facility]                   [phone]                   Fax:[phone]

## 2017-04-26 NOTE — Progress Notes (Signed)
Spoke with Candee [facility]Shante at [facility] per [facility] Mr. Greggory Keenban's nurse she has received instructions via fax for pt.'s procedure on 05/04/17 and is aware of what time pt. Is to arrive . Also SW  Stated she will try to call pt. POA nephew to be here for precedure at 0700 am in admitting to sign consent for his uncle. SW to let me know this is confirmed

## 2017-04-27 NOTE — Progress Notes (Signed)
Philip [name] at [name]  Left me a voice mail that   She  spoke with Luberta RobertsonYkeo pt. Nephew and POA  at 459 pm on Nov 7th and he said he will be at admitting with pt. On 05/04/17 so he can sign the consent for the procedure.

## 2017-05-03 ENCOUNTER — Encounter (HOSPITAL_COMMUNITY): Payer: Self-pay | Admitting: Anesthesiology

## 2017-05-03 NOTE — Anesthesia Preprocedure Evaluation (Addendum)
Anesthesia Evaluation  Patient identified by MRN, date of birth, ID band Patient awake    Reviewed: Allergy & Precautions, NPO status , Patient's Chart, lab work & pertinent test results, reviewed documented beta blocker date and time   Airway Mallampati: III  TM Distance: >3 FB Neck ROM: Limited    Dental no notable dental hx. (+) Teeth Intact   Pulmonary pneumonia, resolved,    Pulmonary exam normal breath sounds clear to auscultation       Cardiovascular hypertension, Pt. on medications and Pt. on home beta blockers Normal cardiovascular exam Rhythm:Regular Rate:Normal     Neuro/Psych Right hemiplegia Aphasia CVA, Residual Symptoms negative psych ROS   GI/Hepatic Neg liver ROS, GERD  Medicated and Controlled,Screening colonoscopy   Endo/Other  Hyperlipidemia  Renal/GU negative Renal ROS  negative genitourinary   Musculoskeletal negative musculoskeletal ROS (+)   Abdominal   Peds  Hematology  (+) anemia ,   Anesthesia Other Findings   Reproductive/Obstetrics                            Anesthesia Physical Anesthesia Plan  ASA: III  Anesthesia Plan: MAC   Post-op Pain Management:    Induction: Intravenous  PONV Risk Score and Plan: 2  Airway Management Planned: Natural Airway and Simple Face Mask  Additional Equipment:   Intra-op Plan:   Post-operative Plan:   Informed Consent: I have reviewed the patients History and Physical, chart, labs and discussed the procedure including the risks, benefits and alternatives for the proposed anesthesia with the patient or authorized representative who has indicated his/her understanding and acceptance.   Dental advisory given  Plan Discussed with: CRNA, Anesthesiologist and Surgeon  Anesthesia Plan Comments:        Anesthesia Quick Evaluation

## 2017-05-04 ENCOUNTER — Ambulatory Visit (HOSPITAL_COMMUNITY): Payer: Medicare Other | Admitting: Anesthesiology

## 2017-05-04 ENCOUNTER — Telehealth: Payer: Self-pay

## 2017-05-04 ENCOUNTER — Encounter (HOSPITAL_COMMUNITY): Payer: Self-pay | Admitting: Gastroenterology

## 2017-05-04 ENCOUNTER — Encounter (HOSPITAL_COMMUNITY)
Admission: RE | Disposition: A | Discharge status: Skilled Nursing Facility | Payer: Self-pay | Source: Ambulatory Visit | Attending: Gastroenterology

## 2017-05-04 ENCOUNTER — Ambulatory Visit (HOSPITAL_COMMUNITY)
Admission: RE | Admit: 2017-05-04 | Discharge: 2017-05-04 | Disposition: A | Discharge status: Skilled Nursing Facility | Payer: Medicare Other | Source: Ambulatory Visit | Attending: Gastroenterology | Admitting: Gastroenterology

## 2017-05-04 ENCOUNTER — Other Ambulatory Visit: Payer: Self-pay

## 2017-05-04 DIAGNOSIS — K219 Gastro-esophageal reflux disease without esophagitis: Secondary | ICD-10-CM | POA: Diagnosis not present

## 2017-05-04 DIAGNOSIS — Z538 Procedure and treatment not carried out for other reasons: Secondary | ICD-10-CM

## 2017-05-04 DIAGNOSIS — Z1211 Encounter for screening for malignant neoplasm of colon: Secondary | ICD-10-CM

## 2017-05-04 DIAGNOSIS — R1310 Dysphagia, unspecified: Secondary | ICD-10-CM

## 2017-05-04 DIAGNOSIS — D509 Iron deficiency anemia, unspecified: Secondary | ICD-10-CM | POA: Diagnosis not present

## 2017-05-04 DIAGNOSIS — I69320 Aphasia following cerebral infarction: Secondary | ICD-10-CM | POA: Diagnosis not present

## 2017-05-04 DIAGNOSIS — I10 Essential (primary) hypertension: Secondary | ICD-10-CM | POA: Diagnosis not present

## 2017-05-04 DIAGNOSIS — E559 Vitamin D deficiency, unspecified: Secondary | ICD-10-CM | POA: Diagnosis not present

## 2017-05-04 DIAGNOSIS — I69351 Hemiplegia and hemiparesis following cerebral infarction affecting right dominant side: Secondary | ICD-10-CM | POA: Diagnosis not present

## 2017-05-04 DIAGNOSIS — Z66 Do not resuscitate: Secondary | ICD-10-CM | POA: Insufficient documentation

## 2017-05-04 DIAGNOSIS — Z79899 Other long term (current) drug therapy: Secondary | ICD-10-CM | POA: Insufficient documentation

## 2017-05-04 HISTORY — DX: Aphasia: R47.01

## 2017-05-04 HISTORY — DX: History of falling: Z91.81

## 2017-05-04 HISTORY — PX: COLONOSCOPY WITH PROPOFOL: SHX5780

## 2017-05-04 HISTORY — DX: Difficulty in walking, not elsewhere classified: R26.2

## 2017-05-04 HISTORY — DX: Other supraventricular tachycardia: I47.19

## 2017-05-04 HISTORY — DX: Hyperlipidemia, unspecified: E78.5

## 2017-05-04 HISTORY — PX: ESOPHAGOGASTRODUODENOSCOPY (EGD) WITH PROPOFOL: SHX5813

## 2017-05-04 HISTORY — DX: Supraventricular tachycardia: I47.1

## 2017-05-04 SURGERY — COLONOSCOPY WITH PROPOFOL
Anesthesia: Monitor Anesthesia Care

## 2017-05-04 MED ORDER — LACTATED RINGERS IV SOLN
INTRAVENOUS | Status: DC
  Administered 2017-05-04: 08:00:00 via INTRAVENOUS

## 2017-05-04 MED ORDER — SODIUM CHLORIDE 0.9 % IV SOLN: INTRAVENOUS | Status: DC

## 2017-05-04 MED ORDER — LIDOCAINE 2% (20 MG/ML) 5 ML SYRINGE
INTRAMUSCULAR | Status: AC
  Filled 2017-05-04: qty 5

## 2017-05-04 MED ORDER — PROPOFOL 10 MG/ML IV BOLUS
INTRAVENOUS | Status: AC
  Filled 2017-05-04: qty 60

## 2017-05-04 MED ORDER — LIDOCAINE 2% (20 MG/ML) 5 ML SYRINGE
INTRAMUSCULAR | Status: DC | PRN
  Administered 2017-05-04: 100 mg via INTRAVENOUS

## 2017-05-04 MED ORDER — PROPOFOL 10 MG/ML IV BOLUS
INTRAVENOUS | Status: DC | PRN
  Administered 2017-05-04: 20 mg via INTRAVENOUS

## 2017-05-04 MED ORDER — PROPOFOL 500 MG/50ML IV EMUL
INTRAVENOUS | Status: DC | PRN
  Administered 2017-05-04: 100 ug/kg/min via INTRAVENOUS

## 2017-05-04 SURGICAL SUPPLY — 24 items

## 2017-05-04 NOTE — Interval H&P Note (Signed)
History and Physical Interval Note:  05/04/2017 7:57 AM  Philip [name]  has presented today for surgery, with the diagnosis of IDA, dyspepsia, screening colon/db  The various methods of treatment have been discussed with the patient and family. After consideration of risks, benefits and other options for treatment, the patient has consented to  Procedure(s): COLONOSCOPY WITH PROPOFOL (N/A) ESOPHAGOGASTRODUODENOSCOPY (EGD) WITH PROPOFOL (N/A) as a surgical intervention .  The patient's history has been reviewed, patient examined, no change in status, stable for surgery.  I have reviewed the patient's chart and labs.  Questions were answered to the patient's satisfaction.     Rachael Feeaniel P Jacobs

## 2017-05-04 NOTE — Discharge Instructions (Signed)

## 2017-05-04 NOTE — Op Note (Signed)
[facility] Patient Name: Philip BlueYdria Yoakum Procedure Date: 05/04/2017 MRN: [id number] Attending MD: Rachael Feeaniel P Necia Kamm , MD Date of Birth: 26-May-1951 CSN: [id number] Age: 66 Admit Type: Outpatient Procedure:                Colonoscopy Indications:              Iron deficiency anemia Providers:                Rachael Feeaniel P. Cheston Coury, MD, Dwain SarnaPatricia Ford, RN, Zoila ShutterGary                            Bryant, Technician, Delphia GratesLoraine Chandler, CRNA Referring MD:              Medicines:                Monitored Anesthesia Care Complications:            No immediate complications. Estimated blood loss:                            None. Estimated Blood Loss:     Estimated blood loss: none. Procedure:                Pre-Anesthesia Assessment:                           - Prior to the procedure, a History and Physical                            was performed, and patient medications and                            allergies were reviewed. The patient's tolerance of                            previous anesthesia was also reviewed. The risks                            and benefits of the procedure and the sedation                            options and risks were discussed with the patient.                            All questions were answered, and informed consent                            was obtained. Prior Anticoagulants: The patient has                            taken no previous anticoagulant or antiplatelet                            agents. ASA Grade Assessment: III - A patient with  severe systemic disease. After reviewing the risks                            and benefits, the patient was deemed in                            satisfactory condition to undergo the procedure.                           After obtaining informed consent, the colonoscope                            was passed under direct vision. Throughout the                            procedure, the patient's  blood pressure, pulse, and                            oxygen saturations were monitored continuously. The                            EC-[id number] ([id number] scope was introduced through                            the anus with the intention of advancing to the                            cecum. The scope was advanced to the rectum before                            the procedure was aborted. Medications were given.                            The colonoscopy was technically difficult and                            complex due to poor bowel prep with stool present.                            No anatomical landmarks were photographed. The                            quality of the bowel preparation was very poor (did                            he take any prep at all?). Scope In: 8:21:30 AM Scope Out: 8:22:39 AM Total Procedure Duration: 0 hours 1 minute 9 seconds  Findings:      A large amount of stool was found in the rectum, precluding       visualization. Impression:               - Large amount of stool in the rectum. I am not  sure if he took ANY of the prep. His NH did not                            provide any information about his prep. Moderate Sedation:      N/A- Per Anesthesia Care Recommendation:           - Patient has a contact number available for                            emergencies. The signs and symptoms of potential                            delayed complications were discussed with the                            patient. Return to normal activities tomorrow.                            Written discharge instructions were provided to the                            patient.                           - Resume previous diet.                           - Continue present medications.                           - Repeat colonoscopy at the next available                            appointment because the bowel preparation was poor                             (I don't think he took any prep at all).                           - My office will communicate with his NH about try                            this again. Procedure Code(s):        --- Professional ---                           [phone], 53, Colonoscopy, flexible; diagnostic,                            including collection of specimen(s) by brushing or                            washing, when performed (separate procedure) Diagnosis Code(s):        --- Professional ---  D50.9, Iron deficiency anemia, unspecified CPT copyright 2016 American Medical Association. All rights reserved. The codes documented in this report are preliminary and upon coder review may  be revised to meet current compliance requirements. Rachael Fee, MD 05/04/2017 8:28:50 AM This report has been signed electronically. Number of Addenda: 0

## 2017-05-04 NOTE — Op Note (Signed)
[facility] Patient Name: Philip BlueYdria Vanpelt Procedure Date: 05/04/2017 MRN: [id number] Attending MD: Rachael Feeaniel P Dimple Bastyr , MD Date of Birth: February 02, 1951 CSN: [id number] Age: [age] Admit Type: Outpatient Procedure:                Upper GI endoscopy Indications:              Iron deficiency anemia, Dysphagia? (points to his                            throat when eating, but is aphasic) Providers:                Rachael Feeaniel P. Chisum Habenicht, MD, Dwain SarnaPatricia Ford, RN, Zoila ShutterGary                            Bryant, Technician, Delphia GratesLoraine Chandler, CRNA Referring MD:              Medicines:                Monitored Anesthesia Care Complications:            No immediate complications. Estimated blood loss:                            None. Estimated Blood Loss:     Estimated blood loss: none. Procedure:                Pre-Anesthesia Assessment:                           - Prior to the procedure, a History and Physical                            was performed, and patient medications and                            allergies were reviewed. The patient's tolerance of                            previous anesthesia was also reviewed. The risks                            and benefits of the procedure and the sedation                            options and risks were discussed with the patient.                            All questions were answered, and informed consent                            was obtained. Prior Anticoagulants: The patient has                            taken no previous anticoagulant or antiplatelet  agents. ASA Grade Assessment: III - A patient with                            severe systemic disease. After reviewing the risks                            and benefits, the patient was deemed in                            satisfactory condition to undergo the procedure.                           After obtaining informed consent, the endoscope was   passed under direct vision. Throughout the                            procedure, the patient's blood pressure, pulse, and                            oxygen saturations were monitored continuously. The                            [id number]) scope was introduced through the                            mouth, and advanced to the second part of duodenum.                            The upper GI endoscopy was accomplished without                            difficulty. The patient tolerated the procedure                            well. Scope In: Scope Out: Findings:      The esophagus was normal.      The stomach was normal.      The examined duodenum was normal. Impression:               - Normal UGI examination. Moderate Sedation:      N/A- Per Anesthesia Care Recommendation:           - Patient has a contact number available for                            emergencies. The signs and symptoms of potential                            delayed complications were discussed with the                            patient. Return to normal activities tomorrow.                            Written discharge instructions were provided to the  patient.                           - Resume previous diet.                           - Continue present medications.                           - No repeat upper endoscopy.                           - I spoke with [facility] nurse at [facility] who has                            known him for many years; he points to his throat                            when he eats, but also when he is not eating (for                            example when asked how he is doing he points to his                            throat, rubs it). He eats enough to maintain his                            weight. Procedure Code(s):        --- Professional ---                           [phone], Esophagogastroduodenoscopy, flexible,                             transoral; diagnostic, including collection of                            specimen(s) by brushing or washing, when performed                            (separate procedure) Diagnosis Code(s):        --- Professional ---                           D50.9, Iron deficiency anemia, unspecified                           R13.10, Dysphagia, unspecified CPT copyright 2016 American Medical Association. All rights reserved. The codes documented in this report are preliminary and upon coder review may  be revised to meet current compliance requirements. Rachael Feeaniel P Kyair Ditommaso, MD 05/04/2017 8:42:33 AM This report has been signed electronically. Number of Addenda: 0

## 2017-05-04 NOTE — Anesthesia Postprocedure Evaluation (Signed)
Anesthesia Post Note  Patient: Philip [name]  Procedure(s) Performed: COLONOSCOPY WITH PROPOFOL (N/A ) ESOPHAGOGASTRODUODENOSCOPY (EGD) WITH PROPOFOL (N/A )     Patient location during evaluation: PACU Anesthesia Type: MAC Level of consciousness: awake and alert Pain management: pain level controlled Vital Signs Assessment: post-procedure vital signs reviewed and stable Respiratory status: spontaneous breathing, nonlabored ventilation and respiratory function stable Cardiovascular status: stable and blood pressure returned to baseline Postop Assessment: no apparent nausea or vomiting Anesthetic complications: no    Last Vitals:  Vitals:   05/04/17 0742 05/04/17 0839  BP: (!) 166/91 125/80  Pulse: 82 74  Resp: 16 19  Temp: 36.7 C 36.7 C  SpO2: 97% 100%    Last Pain:  Vitals:   05/04/17 0839  TempSrc: Oral                 Aaniya Sterba A.

## 2017-05-04 NOTE — Transfer of Care (Signed)
Immediate Anesthesia Transfer of Care Note  Patient: Philip [name]  Procedure(s) Performed: COLONOSCOPY WITH PROPOFOL (N/A ) ESOPHAGOGASTRODUODENOSCOPY (EGD) WITH PROPOFOL (N/A )  Patient Location: PACU and Endoscopy Unit  Anesthesia Type:MAC  Level of Consciousness: sedated and patient cooperative  Airway & Oxygen Therapy: Patient Spontanous Breathing and Patient connected to nasal cannula oxygen  Post-op Assessment: Report given to RN and Post -op Vital signs reviewed and stable  Post vital signs: Reviewed and stable  Last Vitals:  Vitals:   05/04/17 0742  BP: (!) 166/91  Pulse: 82  Resp: 16  Temp: 36.7 C  SpO2: 97%    Last Pain:  Vitals:   05/04/17 0742  TempSrc: Oral         Complications: No apparent anesthesia complications

## 2017-05-04 NOTE — Anesthesia Procedure Notes (Signed)
Procedure Name: MAC Date/Time: 05/04/2017 8:13 AM Performed by: Dione Booze, CRNA Pre-anesthesia Checklist: Patient identified, Suction available, Patient being monitored and Emergency Drugs available Patient Re-evaluated:Patient Re-evaluated prior to induction Oxygen Delivery Method: Nasal cannula Placement Confirmation: positive ETCO2

## 2017-05-04 NOTE — Telephone Encounter (Signed)
-----   Message from Milus Banister, MD sent at 05/04/2017  8:44 AM EST ----- He showed up for colo/EGD UNPREPPED.  I called [facility] and talked to his morning nurse. She was very helpful. Said she knew about the procedure, put him on clears yesterday morning. She found his completely untouched prep.  She isn't sure why he was not given any of the prep last night or this morning but will talk with her superviser about it.  We need to try this again with [facility]; split dose prep (they already have it, unopened).  Next available time that works for his family at [place], [place].  Just colonoscopy, I was able to do his EGD today.  Thanks dj

## 2017-05-05 ENCOUNTER — Encounter (HOSPITAL_COMMUNITY): Payer: Self-pay | Admitting: Gastroenterology

## 2017-05-22 ENCOUNTER — Telehealth: Payer: Self-pay | Admitting: Gastroenterology

## 2017-05-22 NOTE — Telephone Encounter (Signed)
See phone note

## 2017-05-22 NOTE — Telephone Encounter (Signed)
-----   Message from Daniel P Jacobs, MD sent at 05/04/2017  8:44 AM EST ----- He showed up for colo/EGD UNPREPPED.  I called [facility] and talked to his morning nurse. She was very helpful. Said she knew about the procedure, put him on clears yesterday morning. She found his completely untouched prep.  She isn't sure why he was not given any of the prep last night or this morning but will talk with her superviser about it.  We need to try this again with [facility]; split dose prep (they already have it, unopened).  Next available time that works for his family at [facility], [place].  Just colonoscopy, I was able to do his EGD today.  Thanks dj 

## 2017-05-22 NOTE — Telephone Encounter (Signed)
Left message on machine to call back  

## 2017-05-26 ENCOUNTER — Other Ambulatory Visit: Payer: Self-pay

## 2017-05-26 DIAGNOSIS — D509 Iron deficiency anemia, unspecified: Secondary | ICD-10-CM

## 2017-05-26 NOTE — Telephone Encounter (Signed)
The Nurse at [facility] has been advised and instructions faxed to her attention at [phone]

## 2017-06-27 NOTE — Progress Notes (Addendum)
    Philip [name], RN  Registered Nurse  Surgery  Progress Notes  Signed  Date of Service:  06-27-2017               Preop instructions for Philip [name] [name]  Date of Birth  2050-12-01                       Date of Procedure:  06-29-17     Doctor: Philip [name]  Time to arrive at [facility]: 0815 AM   Report to: Admitting  Procedure: COLONSCOPY WITH PROPOFOL Any procedure time changes, MD office will notify you!   Do not eat or drink past midnight the night before your procedure.(To include any tube feedings-must be discontinued) NO MEDICATIONS TO BE GIVEN MORNING OF PROCEDURE.  Reminder:Follow bowel prep instructions per MD office!   Take these morning medications only with sips of water.(or give through gastrostomy or feeding tube).NONE    Facility contact: Philip [name]  NURSE   Phone:   [phone] Philip HoarFAX [phone] Health Care [name] [name] PHONE [phone]  Transportation contact phone#: FACILITY TO PROVIDE TRANSPORTATION  Please send day of procedure:current med list and meds last taken that day, confirm nothing by mouth status from what time, Patient Demographic info( to include DNR status, problem list, allergies)   Bring Insurance card and picture ID Leave all jewelry and other valuables at place where living( no metal or rings to be worn) No contact lens Women-no make-up, no lotions,perfumes,powders Men-no colognes,lotions  Any questions day of procedure,call Endoscopy unit-[phone]   Sent from : Philip CarolinaMisty Philip Hoopes, RN Philip [name] [facility]                   Phone:[phone]                   [phone]

## 2017-06-27 NOTE — Progress Notes (Signed)
Spoke with Philip [name] at [facility]. She stated she received instructions for [name] colonoscopy on 06-29-17 . Unable to reach POA nephew Philip [name] via phone but per Philip [name] at [facility] he is supposed to meet at admitting to sign consent for pt. Procedure.

## 2017-06-29 ENCOUNTER — Ambulatory Visit (HOSPITAL_COMMUNITY)
Admission: RE | Admit: 2017-06-29 | Discharge: 2017-06-29 | Disposition: A | Discharge status: Home / Self Care | Payer: Medicare Other | Source: Ambulatory Visit | Attending: Gastroenterology | Admitting: Gastroenterology

## 2017-06-29 ENCOUNTER — Encounter (HOSPITAL_COMMUNITY): Payer: Self-pay

## 2017-06-29 ENCOUNTER — Ambulatory Visit (HOSPITAL_COMMUNITY): Payer: Medicare Other | Admitting: Anesthesiology

## 2017-06-29 ENCOUNTER — Encounter (HOSPITAL_COMMUNITY)
Admission: RE | Disposition: A | Discharge status: Home / Self Care | Payer: Self-pay | Source: Ambulatory Visit | Attending: Gastroenterology

## 2017-06-29 ENCOUNTER — Other Ambulatory Visit: Payer: Self-pay

## 2017-06-29 DIAGNOSIS — Z79899 Other long term (current) drug therapy: Secondary | ICD-10-CM | POA: Diagnosis not present

## 2017-06-29 DIAGNOSIS — I69351 Hemiplegia and hemiparesis following cerebral infarction affecting right dominant side: Secondary | ICD-10-CM | POA: Insufficient documentation

## 2017-06-29 DIAGNOSIS — Z66 Do not resuscitate: Secondary | ICD-10-CM | POA: Diagnosis not present

## 2017-06-29 DIAGNOSIS — I471 Supraventricular tachycardia: Secondary | ICD-10-CM | POA: Diagnosis not present

## 2017-06-29 DIAGNOSIS — I69320 Aphasia following cerebral infarction: Secondary | ICD-10-CM | POA: Insufficient documentation

## 2017-06-29 DIAGNOSIS — I10 Essential (primary) hypertension: Secondary | ICD-10-CM | POA: Insufficient documentation

## 2017-06-29 DIAGNOSIS — D509 Iron deficiency anemia, unspecified: Secondary | ICD-10-CM

## 2017-06-29 DIAGNOSIS — K219 Gastro-esophageal reflux disease without esophagitis: Secondary | ICD-10-CM | POA: Diagnosis not present

## 2017-06-29 DIAGNOSIS — E559 Vitamin D deficiency, unspecified: Secondary | ICD-10-CM | POA: Diagnosis not present

## 2017-06-29 DIAGNOSIS — E785 Hyperlipidemia, unspecified: Secondary | ICD-10-CM | POA: Diagnosis not present

## 2017-06-29 DIAGNOSIS — I4891 Unspecified atrial fibrillation: Secondary | ICD-10-CM | POA: Diagnosis not present

## 2017-06-29 HISTORY — PX: COLONOSCOPY WITH PROPOFOL: SHX5780

## 2017-06-29 SURGERY — COLONOSCOPY WITH PROPOFOL
Anesthesia: Monitor Anesthesia Care

## 2017-06-29 MED ORDER — SODIUM CHLORIDE 0.9 % IV SOLN: INTRAVENOUS | Status: DC

## 2017-06-29 MED ORDER — LACTATED RINGERS IV SOLN
INTRAVENOUS | Status: DC
  Administered 2017-06-29: 09:00:00 via INTRAVENOUS

## 2017-06-29 MED ORDER — LIDOCAINE 2% (20 MG/ML) 5 ML SYRINGE
INTRAMUSCULAR | Status: DC | PRN
  Administered 2017-06-29: 40 mg via INTRAVENOUS

## 2017-06-29 MED ORDER — PROPOFOL 10 MG/ML IV BOLUS
INTRAVENOUS | Status: AC
  Filled 2017-06-29: qty 40

## 2017-06-29 MED ORDER — PROPOFOL 10 MG/ML IV BOLUS
INTRAVENOUS | Status: DC | PRN
  Administered 2017-06-29 (×10): 20 mg via INTRAVENOUS

## 2017-06-29 SURGICAL SUPPLY — 21 items

## 2017-06-29 NOTE — Anesthesia Postprocedure Evaluation (Signed)
Anesthesia Post Note  Patient: Philip [name]  Procedure(s) Performed: COLONOSCOPY WITH PROPOFOL (N/A )     Patient location during evaluation: PACU Anesthesia Type: MAC Level of consciousness: awake and alert Pain management: pain level controlled Vital Signs Assessment: post-procedure vital signs reviewed and stable Respiratory status: spontaneous breathing, nonlabored ventilation, respiratory function stable and patient connected to nasal cannula oxygen Cardiovascular status: stable and blood pressure returned to baseline Postop Assessment: no apparent nausea or vomiting Anesthetic complications: no    Last Vitals:  Vitals:   06/29/17 1015 06/29/17 1020  BP: (!) 168/101   Pulse: 77 79  Resp: 14 12  Temp:    SpO2: 97% 96%    Last Pain:  Vitals:   06/29/17 0917  TempSrc: Oral                 [name] S

## 2017-06-29 NOTE — Discharge Instructions (Signed)

## 2017-06-29 NOTE — Progress Notes (Signed)
[employer] called at 1000am for pt to be picked up at 1030. They recalled  And said would be here at 1110. [employer] [facility] showed up at 1115 to take pt to [facility]ter waited with patient til pickedup

## 2017-06-29 NOTE — Transfer of Care (Signed)
Immediate Anesthesia Transfer of Care Note  Patient: Philip [name]  Procedure(s) Performed: COLONOSCOPY WITH PROPOFOL (N/A )  Patient Location: PACU  Anesthesia Type:MAC  Level of Consciousness: sedated  Airway & Oxygen Therapy: Patient Spontanous Breathing and Patient connected to face mask oxygen  Post-op Assessment: Report given to RN and Post -op Vital signs reviewed and stable  Post vital signs: Reviewed and stable  Last Vitals:  Vitals:   06/29/17 0917  BP: (!) 180/106  Pulse: 94  Resp: 15  Temp: 37.5 C  SpO2: 95%    Last Pain:  Vitals:   06/29/17 0917  TempSrc: Oral         Complications: No apparent anesthesia complications

## 2017-06-29 NOTE — Anesthesia Preprocedure Evaluation (Signed)
Anesthesia Evaluation  Patient identified by MRN, date of birth, ID band Patient awake    Reviewed: Allergy & Precautions, H&P , NPO status , Patient's Chart, lab work & pertinent test results  Airway Mallampati: II   Neck ROM: full    Dental   Pulmonary    breath sounds clear to auscultation       Cardiovascular hypertension, + dysrhythmias Atrial Fibrillation  Rhythm:regular Rate:Normal     Neuro/Psych CVA    GI/Hepatic GERD  ,  Endo/Other    Renal/GU      Musculoskeletal   Abdominal   Peds  Hematology   Anesthesia Other Findings   Reproductive/Obstetrics                             Anesthesia Physical Anesthesia Plan  ASA: III  Anesthesia Plan: MAC   Post-op Pain Management:    Induction: Intravenous  PONV Risk Score and Plan: 1 and Propofol infusion and Treatment may vary due to age or medical condition  Airway Management Planned: Simple Face Mask  Additional Equipment:   Intra-op Plan:   Post-operative Plan:   Informed Consent: I have reviewed the patients History and Physical, chart, labs and discussed the procedure including the risks, benefits and alternatives for the proposed anesthesia with the patient or authorized representative who has indicated his/her understanding and acceptance.     Plan Discussed with: CRNA, Anesthesiologist and Surgeon  Anesthesia Plan Comments:         Anesthesia Quick Evaluation

## 2017-06-29 NOTE — H&P (Signed)
HPI: This is a man with IDA  Chief complaint is IDA  Difficult to communicate with him, even for his kids due to language and aphasia from stroke.  Tried to do colonoscopy 2 months ago but NH did not give him ANY of his prep. Rescheduled for today.  ROS: complete GI ROS as described in HPI, all other review negative.  Constitutional:  No unintentional weight loss   Past Medical History:  Diagnosis Date  . Anemia   . Aphasia   . At high risk for falls   . CVA (cerebral vascular accident) (HCC)    RIGHT HEMIPERESIS  . Difficulty in walking   . DYSPHAGIA 07/09/2008   Qualifier: Diagnosis of  By: Christella Hartigan MD, Melton Alar   . Essential hypertension, benign   . Essential hypertension, malignant   . GERD (gastroesophageal reflux disease)   . Hemiplegia affecting dominant side, late effect of cerebrovascular disease   . Hemiplegia and hemiparesis following cerebral infarction affecting right dominant side (HCC)   . Hyperlipidemia   . Intracerebral hemorrhage (HCC)   . Iron deficiency anemia   . PAT (paroxysmal atrial tachycardia) (HCC)    NO ANTICOAGULATION DUE TO CEREBRAL HEMORRHAGE  . Pneumonia   . Risk for falls   . Thyroid disease   . Vitamin D deficiency     Past Surgical History:  Procedure Laterality Date  . COLONOSCOPY WITH PROPOFOL N/A 05/04/2017   Procedure: COLONOSCOPY WITH PROPOFOL;  Surgeon: Rachael Fee, MD;  Location: [facility] ENDOSCOPY;  Service: Endoscopy;  Laterality: N/A;  . ESOPHAGOGASTRODUODENOSCOPY (EGD) WITH PROPOFOL N/A 05/04/2017   Procedure: ESOPHAGOGASTRODUODENOSCOPY (EGD) WITH PROPOFOL;  Surgeon: Rachael Fee, MD;  Location: [facility] ENDOSCOPY;  Service: Endoscopy;  Laterality: N/A;  . PEG PLACEMENT      Current Facility-Administered Medications  Medication Dose Route Frequency Provider Last Rate Last Dose  . 0.9 %  sodium chloride infusion   Intravenous Continuous Rachael Fee, MD      . lactated ringers infusion   Intravenous Continuous Rachael Fee, MD        Allergies as of 05/26/2017  . (No Known Allergies)    Family History  Family history unknown: Yes    Social History   Socioeconomic History  . Marital status: Single    Spouse name: Not on file  . Number of children: 0  . Years of education: Not on file  . Highest education level: Not on file  Social Needs  . Financial resource strain: Not on file  . Food insecurity - worry: Not on file  . Food insecurity - inability: Not on file  . Transportation needs - medical: Not on file  . Transportation needs - non-medical: Not on file  Occupational History  . Occupation: disabled  Tobacco Use  . Smoking status: Unknown If Ever Smoked  . Smokeless tobacco: Never Used  Substance and Sexual Activity  . Alcohol use: No  . Drug use: No  . Sexual activity: Not on file  Other Topics Concern  . Not on file  Social History Narrative   Lives at [place]   Married    Birth Place [place]    DNR     Physical Exam: There were no vitals taken for this visit. Constitutional: generally well-appearing Psychiatric: alert and oriented x3 Abdomen: soft, nontender, nondistended, no obvious ascites, no peritoneal signs, normal bowel sounds No peripheral edema noted in lower extremities  Assessment and plan: 67 y.o. male with IDA  For colonoscopy  today  Please see the "Patient Instructions" section for addition details about the plan.  Rob Buntinganiel Sheera Illingworth, MD [facility] 06/29/2017, 9:14 AM

## 2017-06-29 NOTE — Progress Notes (Signed)
Talked with Radio broadcast assistantChristy RN at [phone].  Gave results of test and instructions. Along with bp was elevated on admission and was still elevated slightly below admission when leaving. Asked to give bp med when back in facility. Neysa BonitoChristy stated should would give med when back.

## 2017-06-29 NOTE — Op Note (Signed)
[facility] Patient Name: Philip [name] Procedure Date: 06/29/2017 MRN: [id number] Attending MD: Rachael Fee , MD Date of Birth: [birth year] CSN: [id number] Age: 67 Admit Type: Outpatient Procedure:                Colonoscopy Indications:              Iron deficiency anemia; EGD 2 months ago no clear                            explanation. Providers:                Rachael Fee, MD, Tomma Rakers, RN, Beryle Beams, Technician, Doreene Burke, CRNA Referring MD:              Medicines:                Monitored Anesthesia Care Complications:            No immediate complications. Estimated blood loss:                            None. Estimated Blood Loss:     Estimated blood loss: none. Procedure:                Pre-Anesthesia Assessment:                           - Prior to the procedure, a History and Physical                            was performed, and patient medications and                            allergies were reviewed. The patient's tolerance of                            previous anesthesia was also reviewed. The risks                            and benefits of the procedure and the sedation                            options and risks were discussed with the patient.                            All questions were answered, and informed consent                            was obtained. Prior Anticoagulants: The patient has                            taken no previous anticoagulant or antiplatelet                            agents. ASA  Grade Assessment: II - A patient with                            mild systemic disease. After reviewing the risks                            and benefits, the patient was deemed in                            satisfactory condition to undergo the procedure.                           After obtaining informed consent, the colonoscope                            was passed under direct vision.  Throughout the                            procedure, the patient's blood pressure, pulse, and                            oxygen saturations were monitored continuously. The                            [id number] ([id number] scope was introduced through                            the anus and advanced to the the cecum, identified                            by appendiceal orifice and ileocecal valve. The                            colonoscopy was performed without difficulty. The                            patient tolerated the procedure well. The quality                            of the bowel preparation was good. The ileocecal                            valve, appendiceal orifice, and rectum were                            photographed. Scope In: 9:33:54 AM Scope Out: 9:47:49 AM Scope Withdrawal Time: 0 hours 8 minutes 34 seconds  Total Procedure Duration: 0 hours 13 minutes 55 seconds  Findings:      The entire examined colon appeared normal on direct and retroflexion       views. Impression:               - The entire examined colon is normal on direct and  retroflexion views.                           - No polyps or cancers. Moderate Sedation:      N/A- Per Anesthesia Care Recommendation:           - Patient has a contact number available for                            emergencies. The signs and symptoms of potential                            delayed complications were discussed with the                            patient. Return to normal activities tomorrow.                            Written discharge instructions were provided to the                            patient.                           - Resume previous diet.                           - Continue present medications.                           - Follow up with your PCP, hematologist.                           - GI follow up as needed. Procedure Code(s):        --- Professional ---                            [phone], Colonoscopy, flexible; diagnostic, including                            collection of specimen(s) by brushing or washing,                            when performed (separate procedure) Diagnosis Code(s):        --- Professional ---                           D50.9, Iron deficiency anemia, unspecified CPT copyright 2016 American Medical Association. All rights reserved. The codes documented in this report are preliminary and upon coder review may  be revised to meet current compliance requirements. Rachael Fee, MD 06/29/2017 9:59:06 AM This report has been signed electronically. Number of Addenda: 0

## 2017-06-30 ENCOUNTER — Encounter (HOSPITAL_COMMUNITY): Payer: Self-pay | Admitting: Gastroenterology

## 2018-02-16 ENCOUNTER — Telehealth: Payer: Self-pay | Admitting: Oncology

## 2018-02-16 NOTE — Telephone Encounter (Signed)
I received a call from Juliet at [facility] to schedule a hematology appt for the pt. An appt has been scheduled for the pt to see Dr. Clelia CroftShadad on 9/26 at 11am. Juliis aware the pt should arrive 30 minutes early. Letter mailed.

## 2018-03-15 ENCOUNTER — Inpatient Hospital Stay: Payer: Medicare Other | Attending: Oncology | Admitting: Oncology

## 2018-03-15 ENCOUNTER — Telehealth: Payer: Self-pay

## 2018-03-15 ENCOUNTER — Inpatient Hospital Stay: Payer: Medicare Other

## 2018-03-15 VITALS — BP 175/102 | HR 68 | Temp 98.3°F | Resp 17 | Ht 66.0 in

## 2018-03-15 DIAGNOSIS — Z79899 Other long term (current) drug therapy: Secondary | ICD-10-CM | POA: Diagnosis not present

## 2018-03-15 DIAGNOSIS — I69351 Hemiplegia and hemiparesis following cerebral infarction affecting right dominant side: Secondary | ICD-10-CM

## 2018-03-15 DIAGNOSIS — E559 Vitamin D deficiency, unspecified: Secondary | ICD-10-CM | POA: Insufficient documentation

## 2018-03-15 DIAGNOSIS — D509 Iron deficiency anemia, unspecified: Secondary | ICD-10-CM

## 2018-03-15 DIAGNOSIS — I10 Essential (primary) hypertension: Secondary | ICD-10-CM | POA: Insufficient documentation

## 2018-03-15 LAB — CMP (CANCER CENTER ONLY)
ALBUMIN: 4 g/dL (ref 3.5–5.0)
ALT: 23 U/L (ref 0–44)
AST: 18 U/L (ref 15–41)
Alkaline Phosphatase: 101 U/L (ref 38–126)
Anion gap: 7 (ref 5–15)
BUN: 12 mg/dL (ref 8–23)
CHLORIDE: 106 mmol/L (ref 98–111)
CO2: 29 mmol/L (ref 22–32)
CREATININE: 0.94 mg/dL (ref 0.61–1.24)
Calcium: 9.4 mg/dL (ref 8.9–10.3)
GFR, Est AFR Am: >60 mL/min (ref >60)
GLUCOSE: 124 mg/dL — AB (ref 70–99)
POTASSIUM: 4 mmol/L (ref 3.5–5.1)
SODIUM: 142 mmol/L (ref 135–145)
Total Bilirubin: 0.8 mg/dL (ref 0.3–1.2)
Total Protein: 8.5 g/dL — ABNORMAL HIGH (ref 6.5–8.1)

## 2018-03-15 LAB — IRON AND TIBC
IRON: 72 ug/dL (ref 42–163)
Saturation Ratios: 27 % — ABNORMAL LOW (ref 42–163)
TIBC: 273 ug/dL (ref 202–409)
UIBC: 200 ug/dL

## 2018-03-15 LAB — CBC WITH DIFFERENTIAL (CANCER CENTER ONLY)
BASOS ABS: 0 10*3/uL (ref 0.0–0.1)
Basophils Relative: 1 %
EOS ABS: 0.2 10*3/uL (ref 0.0–0.5)
EOS PCT: 3 %
HCT: 35.7 % — ABNORMAL LOW (ref 38.4–49.9)
Hemoglobin: 11.5 g/dL — ABNORMAL LOW (ref 13.0–17.1)
LYMPHS PCT: 16 %
Lymphs Abs: 1.4 10*3/uL (ref 0.9–3.3)
MCH: 19.5 pg — ABNORMAL LOW (ref 27.2–33.4)
MCHC: 32.2 g/dL (ref 32.0–36.0)
MCV: 60.7 fL — AB (ref 79.3–98.0)
MONO ABS: 0.7 10*3/uL (ref 0.1–0.9)
Monocytes Relative: 8 %
Neutro Abs: 6.6 10*3/uL — ABNORMAL HIGH (ref 1.5–6.5)
Neutrophils Relative %: 72 %
PLATELETS: 227 10*3/uL (ref 140–400)
RBC: 5.89 MIL/uL — AB (ref 4.20–5.82)
RDW: 16.2 % — AB (ref 11.0–14.6)
WBC: 9 10*3/uL (ref 4.0–10.3)

## 2018-03-15 LAB — FERRITIN: Ferritin: 347 ng/mL — ABNORMAL HIGH (ref 24–336)

## 2018-03-15 NOTE — Telephone Encounter (Signed)
Printed avs and calender of upcoming appointment. Per 9/26 los 

## 2018-03-15 NOTE — Progress Notes (Signed)
Reason for the request:   Microcytic anemia  HPI: I was asked by Dr. Durenda Hurt  to evaluate Philip [name] further evaluation of anemia.  He is a pleasant gentleman from [place] with history of CVA and right-sided hemiparesis and a aphasia.  He has history of thalassemia that has been documented Dr. Gweneth Dimitri in August 2018.  At that time his hemoglobin electrophoresis showed homozygous hemoglobin E with a hemoglobin of 11.6 and MCV of 58.7.  He is asymptomatic from this finding and does not require any packed red cell transfusion.  He had a GI work-up completed by Dr. Christella Hartigan area of 2019.  His review of system was difficult to obtain today because of language barrier despite an interpreter and his a aphasia.  Reports spasm in his right lower extremity is his main complaints.  He does not report any headaches, blurry vision,or seizures. Does not report any fevers, chills or sweats.  Does not report any cough, wheezing or hemoptysis.  Does not report any chest pain, palpitation, orthopnea or leg edema.  Does not report any nausea, vomiting or abdominal pain.  Does not report any constipation or diarrhea.  Does not report any skeletal complaints.    Does not report frequency, urgency or hematuria.  Does not report any skin rashes or lesions.  Does not report any lymphadenopathy or petechiae.  Remaining review of systems is negative.    Past Medical History:  Diagnosis Date  . Anemia   . Aphasia   . At high risk for falls   . CVA (cerebral vascular accident) (HCC)    RIGHT HEMIPERESIS  . Difficulty in walking   . DYSPHAGIA 07/09/2008   Qualifier: Diagnosis of  By: Christella Hartigan MD, Philip [name]   . Essential hypertension, benign   . Essential hypertension, malignant   . GERD (gastroesophageal reflux disease)   . Hemiplegia affecting dominant side, late effect of cerebrovascular disease   . Hemiplegia and hemiparesis following cerebral infarction affecting right dominant side (HCC)   . Hyperlipidemia   .  Intracerebral hemorrhage (HCC)   . Iron deficiency anemia   . PAT (paroxysmal atrial tachycardia) (HCC)    NO ANTICOAGULATION DUE TO CEREBRAL HEMORRHAGE  . Pneumonia   . Risk for falls   . Thyroid disease   . Vitamin D deficiency   :  Past Surgical History:  Procedure Laterality Date  . COLONOSCOPY WITH PROPOFOL N/A 05/04/2017   Procedure: COLONOSCOPY WITH PROPOFOL;  Surgeon: Rachael Fee, MD;  Location: [facility];  Service: Endoscopy;  Laterality: N/A;  . COLONOSCOPY WITH PROPOFOL N/A 06/29/2017   Procedure: COLONOSCOPY WITH PROPOFOL;  Surgeon: Rachael Fee, MD;  Location: [facility];  Service: Endoscopy;  Laterality: N/A;  . ESOPHAGOGASTRODUODENOSCOPY (EGD) WITH PROPOFOL N/A 05/04/2017   Procedure: ESOPHAGOGASTRODUODENOSCOPY (EGD) WITH PROPOFOL;  Surgeon: Rachael Fee, MD;  Location: [facility];  Service: Endoscopy;  Laterality: N/A;  . PEG PLACEMENT    :   Current Outpatient Medications:  .  acetaminophen (TYLENOL) 500 MG tablet, Take 1,000 mg 2 (two) times daily by mouth. (0900 & 2100), Disp: , Rfl:  .  atorvastatin (LIPITOR) 40 MG tablet, Take 40 mg at bedtime by mouth. (2100), Disp: , Rfl:  .  baclofen (LIORESAL) 10 MG tablet, Take 10 mg 3 (three) times daily by mouth. (0900, 1700, 2100), Disp: , Rfl:  .  benazepril (LOTENSIN) 10 MG tablet, Take 10 mg daily by mouth. (0800), Disp: , Rfl:  .  cholecalciferol (VITAMIN D) 1000 UNITS  tablet, Take 1,000 Units daily by mouth. (0900), Disp: , Rfl:  .  labetalol (NORMODYNE) 100 MG tablet, Take 100 mg daily by mouth. (0900), Disp: , Rfl:  .  Multiple Vitamins-Minerals (CERTAGEN PO), Take 1 tablet daily by mouth. (0900), Disp: , Rfl:  .  Nutritional Supplements (RESOURCE PO), Take 60 mLs 3 (three) times daily by mouth. (0900, 1300, 1700), Disp: , Rfl:  .  omeprazole (PRILOSEC) 40 MG capsule, Take 40 mg 2 (two) times daily by mouth. (0600 & 1700), Disp: , Rfl: :  No Known Allergies:  Family History  Family history  unknown: Yes  :  Social History   Socioeconomic History  . Marital status: Single    Spouse name: Not on file  . Number of children: 0  . Years of education: Not on file  . Highest education level: Not on file  Occupational History  . Occupation: disabled  Social Needs  . Financial resource strain: Not on file  . Food insecurity:    Worry: Not on file    Inability: Not on file  . Transportation needs:    Medical: Not on file    Non-medical: Not on file  Tobacco Use  . Smoking status: Unknown If Ever Smoked  . Smokeless tobacco: Never Used  Substance and Sexual Activity  . Alcohol use: No  . Drug use: No  . Sexual activity: Not on file  Lifestyle  . Physical activity:    Days per week: Not on file    Minutes per session: Not on file  . Stress: Not on file  Relationships  . Social connections:    Talks on phone: Not on file    Gets together: Not on file    Attends religious service: Not on file    Active member of club or organization: Not on file    Attends meetings of clubs or organizations: Not on file    Relationship status: Not on file  . Intimate partner violence:    Fear of current or ex partner: Not on file    Emotionally abused: Not on file    Physically abused: Not on file    Forced sexual activity: Not on file  Other Topics Concern  . Not on file  Social History Narrative   Lives at [facility]   Married    Birth Place [place]    DNR  :  Pertinent items are noted in HPI.  Exam: Blood pressure (!) 175/102, pulse 68, temperature 98.3 F (36.8 C), temperature source Oral, resp. rate 17, height 5\' 6"  (1.676 m), SpO2 97 %.  ECOG 2 General appearance: alert and cooperative appeared without distress.  In a wheelchair today. Head: atraumatic without any abnormalities. Eyes: conjunctivae/corneas clear. PERRL.  Sclera anicteric. Throat: lips, mucosa, and tongue normal; without oral thrush or ulcers. Resp: clear to auscultation bilaterally without  rhonchi, wheezes or dullness to percussion. Cardio: regular rate and rhythm, S1, S2 normal, no murmur, click, rub or gallop GI: soft, non-tender; bowel sounds normal; no masses,  no organomegaly Skin: Skin color, texture, turgor normal. No rashes or lesions Lymph nodes: Cervical, supraclavicular, and axillary nodes normal. Neurologic: Grossly normal without any motor, sensory or deep tendon reflexes. Musculoskeletal: No joint deformity or effusion.  Assessment and Plan:   68 year old man with the following:  1.  Microcytic, hypochromic anemia documented in 2009.  He had MCV range in the 60s for at least the last 10 years.  With a hemoglobin have been between 9  and 11 range.  His anemia work-up completed in August 2018 did not show any evidence of iron deficiency and confirmed the presence of thalassemia.  Repeat CBC today was reviewed globin of 11.5 which is consistent of his baseline associated with thalassemia.  The natural course of this disease was reviewed today with the patient to be an interpreter.  At this time, I will repeat his iron studies to ensure there is no concomitant iron deficiency in addition to his thalassemia.  Will likely rule out any iron deficiency at this time.  His baseline hemoglobin will continue to be around 11 to 10 g/dL with MCV chronically will be low around 60.  He does not require any intervention regarding this finding.  He does not require any further evaluation if his iron studies are normal.  2.  Follow-up: We will be determined in 4 months if his iron studies are low.  If his iron studies are normal then no further follow-up will be needed.  30  minutes was spent with the patient face-to-face today.  More than 50% of time was dedicated to reviewing the natural course of his disease, reviewing laboratory data and coordinating plan of care.     Thank you for the referral. A copy of this consult has been forwarded to the requesting physician.

## 2018-04-17 ENCOUNTER — Ambulatory Visit: Payer: Medicare Other | Admitting: Gastroenterology

## 2018-04-24 ENCOUNTER — Telehealth: Payer: Self-pay | Admitting: *Deleted

## 2018-04-24 NOTE — Telephone Encounter (Signed)
Medical records faxed to [facility] - Tonye Becket NP; release [phone]

## 2018-05-25 ENCOUNTER — Encounter: Payer: Self-pay | Admitting: Gastroenterology

## 2018-05-25 ENCOUNTER — Ambulatory Visit (INDEPENDENT_AMBULATORY_CARE_PROVIDER_SITE_OTHER): Payer: Medicare Other | Admitting: Gastroenterology

## 2018-05-25 VITALS — BP 144/82

## 2018-05-25 DIAGNOSIS — R1310 Dysphagia, unspecified: Secondary | ICD-10-CM | POA: Diagnosis not present

## 2018-05-25 NOTE — Progress Notes (Signed)
Review of pertinent gastrointestinal problems: 1.  Routine risk for colon cancer.  Colonoscopy January 2019, Dr. Christella Hartigan for microcytic anemia was completely normal.  EGD 04/2017 was also completely normal. 2.  Microcytic anemia work-up by hematology confirms thalassemia, he is not iron deficient. 3.  Question dysphasia.  Very difficult to determine he points at his throat often.  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  EGD 04/2017 was completely normal.   HPI: Philip is an a phasic and [place] speaking gentleman who was sent here by his nursing home.  No caregiver was with him from his nursing home.  A [place] translator is present however she cannot understand anything he says because he has had a stroke and has a aphasia.  A family member of his showed up who I believe is his nephew and he cannot understand his uncle at all either.  The 3 of [place] in the room were really completely at a loss to figure out why he was sent here.  There was some paperwork that was sent with him signed by a physician at the nursing home earlier last month and it said "dysphasia".  He does point to his throat a lot while talking to him.   Chief complaint is dysphasia  ROS: complete GI ROS as described in HPI, all other review negative.  Constitutional:  No unintentional weight loss   Past Medical History:  Diagnosis Date  . Anemia   . Aphasia   . At high risk for falls   . CVA (cerebral vascular accident) (HCC)    RIGHT HEMIPERESIS  . Difficulty in walking   . DYSPHAGIA 07/09/2008   Qualifier: Diagnosis of  By: Christella Hartigan MD, Melton Alar   . Essential hypertension, benign   . Essential hypertension, malignant   . GERD (gastroesophageal reflux disease)   . Hemiplegia affecting dominant side, late effect of cerebrovascular disease   . Hemiplegia and hemiparesis following cerebral infarction affecting right dominant side (HCC)   . Hyperlipidemia   . Intracerebral hemorrhage (HCC)   . Iron deficiency anemia    . PAT (paroxysmal atrial tachycardia) (HCC)    NO ANTICOAGULATION DUE TO CEREBRAL HEMORRHAGE  . Pneumonia   . Risk for falls   . Thyroid disease   . Vitamin D deficiency     Past Surgical History:  Procedure Laterality Date  . COLONOSCOPY WITH PROPOFOL N/A 05/04/2017   Procedure: COLONOSCOPY WITH PROPOFOL;  Surgeon: Rachael Fee, MD;  Location: [facility];  Service: Endoscopy;  Laterality: N/A;  . COLONOSCOPY WITH PROPOFOL N/A 06/29/2017   Procedure: COLONOSCOPY WITH PROPOFOL;  Surgeon: Rachael Fee, MD;  Location: [facility];  Service: Endoscopy;  Laterality: N/A;  . ESOPHAGOGASTRODUODENOSCOPY (EGD) WITH PROPOFOL N/A 05/04/2017   Procedure: ESOPHAGOGASTRODUODENOSCOPY (EGD) WITH PROPOFOL;  Surgeon: Rachael Fee, MD;  Location: [facility];  Service: Endoscopy;  Laterality: N/A;  . PEG PLACEMENT      Current Outpatient Medications  Medication Sig Dispense Refill  . acetaminophen (TYLENOL) 500 MG tablet Take 1,000 mg 2 (two) times daily by mouth. (0900 & 2100)    . atorvastatin (LIPITOR) 40 MG tablet Take 40 mg at bedtime by mouth. (2100)    . baclofen (LIORESAL) 10 MG tablet Take 10 mg 3 (three) times daily by mouth. (0900, 1700, 2100)    . benazepril (LOTENSIN) 10 MG tablet Take 10 mg daily by mouth. (0800)    . cholecalciferol (VITAMIN D) 1000 UNITS tablet Take 1,000 Units daily by mouth. (0900)    .  labetalol (NORMODYNE) 100 MG tablet Take 100 mg daily by mouth. (0900)    . Multiple Vitamins-Minerals (CERTAGEN PO) Take 1 tablet daily by mouth. (0900)    . Nutritional Supplements (RESOURCE PO) Take 60 mLs 3 (three) times daily by mouth. (0900, 1300, 1700)    . omeprazole (PRILOSEC) 40 MG capsule Take 40 mg 2 (two) times daily by mouth. (0600 & 1700)     No current facility-administered medications for Philip visit.     Allergies as of 05/25/2018  . (No Known Allergies)    Family History  Family history unknown: Yes    Social History   Socioeconomic History   . Marital status: Single    Spouse name: Not on file  . Number of children: 0  . Years of education: Not on file  . Highest education level: Not on file  Occupational History  . Occupation: disabled  Social Needs  . Financial resource strain: Not on file  . Food insecurity:    Worry: Not on file    Inability: Not on file  . Transportation needs:    Medical: Not on file    Non-medical: Not on file  Tobacco Use  . Smoking status: Unknown If Ever Smoked  . Smokeless tobacco: Never Used  Substance and Sexual Activity  . Alcohol use: No  . Drug use: No  . Sexual activity: Not on file  Lifestyle  . Physical activity:    Days per week: Not on file    Minutes per session: Not on file  . Stress: Not on file  Relationships  . Social connections:    Talks on phone: Not on file    Gets together: Not on file    Attends religious service: Not on file    Active member of club or organization: Not on file    Attends meetings of clubs or organizations: Not on file    Relationship status: Not on file  . Intimate partner violence:    Fear of current or ex partner: Not on file    Emotionally abused: Not on file    Physically abused: Not on file    Forced sexual activity: Not on file  Other Topics Concern  . Not on file  Social History Narrative   Lives at [facility]   Married    Birth Place [place]    DNR     Physical Exam: BP (!) 144/82  Constitutional: generally well-appearing Psychiatric: alert and oriented x 0 (aphasic Philip does not appear in distress). Abdomen: soft, nontender, nondistended, no obvious ascites, no peritoneal signs, normal bowel sounds No peripheral edema noted in lower extremities  Assessment and plan: 67 y.o. male with aphasia, ? dysphagia  I believe he is here because of swallowing difficulty.  I am not sure that he is truly having any swallowing difficulties however.  He does point to his throat a lot.  I did an upper endoscopy for him almost exactly  1 year ago and it was completely normal.  After that I was able to speak on the phone with a nurse from his facility who tells me that he is able to eat and maintain his nutrition just fine Philip he points to his throat a lot.  I am honestly not sure what to make of him pointing at his throat.  His EGD last year was completely normal.  He is not able to give us any clue as to whether he is having any swallowing issues besides pointing  through his throat no matter what questions you ask him.  I do not recommend any testing at Philip time.  While he was here in the office with his nephew and the [place] interpreter all in the room.  We contacted [facility] nursing facility to try to speak with someone who could help [place] understand why he was here, how we could help.  My staff was on hold with the facility for about 15 minutes, the call was transferred from person to person however no one ever came on the phone that could help [place] with Philip.  I have eventually decided it was futile to stay on the line any longer.  Philip office note will be completed and printed and sent back with him to [facility] nursing facility.  If there are any questions or concerns about the note for my recommendations above I am happy to be contacted anytime.  Please see the "[name] Instructions" section for addition details about the plan.  Rob Bunting, MD [facility] 05/25/2018, 3:29 PM

## 2018-05-25 NOTE — Patient Instructions (Addendum)
He is unable to provide any history and the [place] interpreter cannot help either because he has aphasia.  We tried to contact the referring [facility] (Dr. Harrison MonsBlake) however that facility was closed when we tried to call this afternoon.  We were on hold with the [facility] facility, transferred back and forth from person to person but no one was able to help us understand why the patient was sent here, how we can help.

## 2018-07-12 ENCOUNTER — Inpatient Hospital Stay: Payer: Medicare Other

## 2018-07-12 ENCOUNTER — Inpatient Hospital Stay: Payer: Medicare Other | Attending: Oncology | Admitting: Oncology

## 2018-07-16 ENCOUNTER — Telehealth: Payer: Self-pay | Admitting: Oncology

## 2018-07-16 NOTE — Telephone Encounter (Signed)
Called to r/s mised appt - per nephew says that he was told that f/u was not needed - message sent to RN and MD .

## 2019-10-04 ENCOUNTER — Inpatient Hospital Stay (HOSPITAL_COMMUNITY)
Admission: EM | Admit: 2019-10-04 | Discharge: 2019-10-19 | DRG: 951 | Disposition: E | Payer: Medicare Other | Attending: Internal Medicine | Admitting: Internal Medicine

## 2019-10-04 ENCOUNTER — Emergency Department (HOSPITAL_COMMUNITY): Payer: Medicare Other

## 2019-10-04 DIAGNOSIS — I69351 Hemiplegia and hemiparesis following cerebral infarction affecting right dominant side: Secondary | ICD-10-CM | POA: Diagnosis not present

## 2019-10-04 DIAGNOSIS — U071 COVID-19: Secondary | ICD-10-CM | POA: Diagnosis present

## 2019-10-04 DIAGNOSIS — G936 Cerebral edema: Secondary | ICD-10-CM | POA: Diagnosis not present

## 2019-10-04 DIAGNOSIS — I69320 Aphasia following cerebral infarction: Secondary | ICD-10-CM

## 2019-10-04 DIAGNOSIS — Z515 Encounter for palliative care: Secondary | ICD-10-CM | POA: Diagnosis present

## 2019-10-04 DIAGNOSIS — R569 Unspecified convulsions: Secondary | ICD-10-CM

## 2019-10-04 DIAGNOSIS — E785 Hyperlipidemia, unspecified: Secondary | ICD-10-CM | POA: Diagnosis present

## 2019-10-04 DIAGNOSIS — I480 Paroxysmal atrial fibrillation: Secondary | ICD-10-CM | POA: Diagnosis present

## 2019-10-04 DIAGNOSIS — N189 Chronic kidney disease, unspecified: Secondary | ICD-10-CM | POA: Diagnosis present

## 2019-10-04 DIAGNOSIS — Z7401 Bed confinement status: Secondary | ICD-10-CM

## 2019-10-04 DIAGNOSIS — I4891 Unspecified atrial fibrillation: Secondary | ICD-10-CM | POA: Diagnosis not present

## 2019-10-04 DIAGNOSIS — Z66 Do not resuscitate: Secondary | ICD-10-CM | POA: Diagnosis present

## 2019-10-04 DIAGNOSIS — G40909 Epilepsy, unspecified, not intractable, without status epilepticus: Secondary | ICD-10-CM | POA: Diagnosis present

## 2019-10-04 DIAGNOSIS — J9601 Acute respiratory failure with hypoxia: Secondary | ICD-10-CM

## 2019-10-04 DIAGNOSIS — I129 Hypertensive chronic kidney disease with stage 1 through stage 4 chronic kidney disease, or unspecified chronic kidney disease: Secondary | ICD-10-CM | POA: Diagnosis present

## 2019-10-04 DIAGNOSIS — I69959 Hemiplegia and hemiparesis following unspecified cerebrovascular disease affecting unspecified side: Secondary | ICD-10-CM

## 2019-10-04 DIAGNOSIS — K219 Gastro-esophageal reflux disease without esophagitis: Secondary | ICD-10-CM | POA: Diagnosis present

## 2019-10-04 DIAGNOSIS — Z993 Dependence on wheelchair: Secondary | ICD-10-CM | POA: Diagnosis not present

## 2019-10-04 DIAGNOSIS — I615 Nontraumatic intracerebral hemorrhage, intraventricular: Principal | ICD-10-CM

## 2019-10-04 LAB — CBC WITH DIFFERENTIAL/PLATELET
Abs Immature Granulocytes: 0.11 10*3/uL — ABNORMAL HIGH (ref 0.00–0.07)
Basophils Absolute: 0.1 10*3/uL (ref 0.0–0.1)
Basophils Relative: 0 %
Eosinophils Absolute: 0.4 10*3/uL (ref 0.0–0.5)
Eosinophils Relative: 2 %
HCT: 36.3 % — ABNORMAL LOW (ref 39.0–52.0)
Hemoglobin: 11.7 g/dL — ABNORMAL LOW (ref 13.0–17.0)
Immature Granulocytes: 1 %
Lymphocytes Relative: 18 %
Lymphs Abs: 3.7 10*3/uL (ref 0.7–4.0)
MCH: 19 pg — ABNORMAL LOW (ref 26.0–34.0)
MCHC: 32.2 g/dL (ref 30.0–36.0)
MCV: 58.8 fL — ABNORMAL LOW (ref 80.0–100.0)
Monocytes Absolute: 0.9 10*3/uL (ref 0.1–1.0)
Monocytes Relative: 5 %
Neutro Abs: 14.9 10*3/uL — ABNORMAL HIGH (ref 1.7–7.7)
Neutrophils Relative %: 74 %
Platelets: 251 10*3/uL (ref 150–400)
RBC: 6.17 MIL/uL — ABNORMAL HIGH (ref 4.22–5.81)
RDW: 19.1 % — ABNORMAL HIGH (ref 11.5–15.5)
WBC: 20 10*3/uL — ABNORMAL HIGH (ref 4.0–10.5)
nRBC: 0.3 % — ABNORMAL HIGH (ref 0.0–0.2)

## 2019-10-04 LAB — POCT I-STAT 7, (LYTES, BLD GAS, ICA,H+H)
Acid-base deficit: 5 mmol/L — ABNORMAL HIGH (ref 0.0–2.0)
Bicarbonate: 20.9 mmol/L (ref 20.0–28.0)
Calcium, Ion: 1.23 mmol/L (ref 1.15–1.40)
HCT: 36 % — ABNORMAL LOW (ref 39.0–52.0)
Hemoglobin: 12.2 g/dL — ABNORMAL LOW (ref 13.0–17.0)
O2 Saturation: 94 %
Patient temperature: 96
Potassium: 3.2 mmol/L — ABNORMAL LOW (ref 3.5–5.1)
Sodium: 141 mmol/L (ref 135–145)
TCO2: 22 mmol/L (ref 22–32)
pCO2 arterial: 38.4 mmHg (ref 32.0–48.0)
pH, Arterial: 7.337 — ABNORMAL LOW (ref 7.350–7.450)
pO2, Arterial: 68 mmHg — ABNORMAL LOW (ref 83.0–108.0)

## 2019-10-04 LAB — RAPID URINE DRUG SCREEN, HOSP PERFORMED
Amphetamines: NOT DETECTED
Barbiturates: NOT DETECTED
Benzodiazepines: POSITIVE — AB
Cocaine: NOT DETECTED
Opiates: NOT DETECTED
Tetrahydrocannabinol: NOT DETECTED

## 2019-10-04 LAB — ETHANOL: Alcohol, Ethyl (B): <10 mg/dL (ref <10)

## 2019-10-04 LAB — URINALYSIS, ROUTINE W REFLEX MICROSCOPIC
Bacteria, UA: NONE SEEN
Bilirubin Urine: NEGATIVE
Glucose, UA: 50 mg/dL — AB
Hgb urine dipstick: NEGATIVE
Ketones, ur: 5 mg/dL — AB
Leukocytes,Ua: NEGATIVE
Nitrite: NEGATIVE
Protein, ur: 100 mg/dL — AB
Specific Gravity, Urine: 1.01 (ref 1.005–1.030)
pH: 7 (ref 5.0–8.0)

## 2019-10-04 LAB — PROTIME-INR
INR: 1.1 (ref 0.8–1.2)
Prothrombin Time: 13.8 seconds (ref 11.4–15.2)

## 2019-10-04 LAB — I-STAT CHEM 8, ED
BUN: 9 mg/dL (ref 8–23)
Calcium, Ion: 1.19 mmol/L (ref 1.15–1.40)
Chloride: 104 mmol/L (ref 98–111)
Creatinine, Ser: 1 mg/dL (ref 0.61–1.24)
Glucose, Bld: 203 mg/dL — ABNORMAL HIGH (ref 70–99)
HCT: 38 % — ABNORMAL LOW (ref 39.0–52.0)
Hemoglobin: 12.9 g/dL — ABNORMAL LOW (ref 13.0–17.0)
Potassium: 3.9 mmol/L (ref 3.5–5.1)
Sodium: 139 mmol/L (ref 135–145)
TCO2: 24 mmol/L (ref 22–32)

## 2019-10-04 LAB — COMPREHENSIVE METABOLIC PANEL
ALT: 14 U/L (ref 0–44)
AST: 25 U/L (ref 15–41)
Albumin: 3.8 g/dL (ref 3.5–5.0)
Alkaline Phosphatase: 77 U/L (ref 38–126)
Anion gap: 17 — ABNORMAL HIGH (ref 5–15)
BUN: 8 mg/dL (ref 8–23)
CO2: 21 mmol/L — ABNORMAL LOW (ref 22–32)
Calcium: 9.1 mg/dL (ref 8.9–10.3)
Chloride: 102 mmol/L (ref 98–111)
Creatinine, Ser: 1.16 mg/dL (ref 0.61–1.24)
GFR calc Af Amer: >60 mL/min (ref >60)
GFR calc non Af Amer: >60 mL/min (ref >60)
Glucose, Bld: 211 mg/dL — ABNORMAL HIGH (ref 70–99)
Potassium: 3.8 mmol/L (ref 3.5–5.1)
Sodium: 140 mmol/L (ref 135–145)
Total Bilirubin: 0.8 mg/dL (ref 0.3–1.2)
Total Protein: 7.7 g/dL (ref 6.5–8.1)

## 2019-10-04 LAB — SARS CORONAVIRUS 2 (TAT 6-24 HRS): SARS Coronavirus 2: POSITIVE — AB

## 2019-10-04 LAB — CBG MONITORING, ED: Glucose-Capillary: 198 mg/dL — ABNORMAL HIGH (ref 70–99)

## 2019-10-04 LAB — MRSA PCR SCREENING: MRSA by PCR: NEGATIVE

## 2019-10-04 LAB — APTT: aPTT: 25 seconds (ref 24–36)

## 2019-10-04 MED ORDER — LEVETIRACETAM IN NACL 1000 MG/100ML IV SOLN
1000.0000 mg | Freq: Once | INTRAVENOUS | Status: AC
  Administered 2019-10-04: 1000 mg via INTRAVENOUS
  Filled 2019-10-04: qty 100

## 2019-10-04 MED ORDER — SUCCINYLCHOLINE CHLORIDE 20 MG/ML IJ SOLN: 90.0000 mg | Freq: Once | INTRAMUSCULAR | Status: DC

## 2019-10-04 MED ORDER — ETOMIDATE 2 MG/ML IV SOLN: 20.0000 mg | Freq: Once | INTRAVENOUS | Status: DC

## 2019-10-04 MED ORDER — HALOPERIDOL LACTATE 5 MG/ML IJ SOLN: 0.5000 mg | INTRAMUSCULAR | Status: DC | PRN

## 2019-10-04 MED ORDER — SODIUM CHLORIDE 0.9 % IV SOLN
12.5000 mg | Freq: Four times a day (QID) | INTRAVENOUS | Status: DC | PRN
  Filled 2019-10-04 (×2): qty 0.5

## 2019-10-04 MED ORDER — HALOPERIDOL 0.5 MG PO TABS
0.5000 mg | ORAL_TABLET | ORAL | Status: DC | PRN
  Filled 2019-10-04: qty 1

## 2019-10-04 MED ORDER — GLYCOPYRROLATE 1 MG PO TABS
1.0000 mg | ORAL_TABLET | ORAL | Status: DC | PRN
  Filled 2019-10-04: qty 1

## 2019-10-04 MED ORDER — SODIUM CHLORIDE 0.9 % IV SOLN
INTRAVENOUS | Status: DC
  Administered 2019-10-04: 20 mL/h via INTRAVENOUS

## 2019-10-04 MED ORDER — HALOPERIDOL LACTATE 2 MG/ML PO CONC
0.5000 mg | ORAL | Status: DC | PRN
  Filled 2019-10-04: qty 0.3

## 2019-10-04 MED ORDER — LORAZEPAM 2 MG/ML IJ SOLN
1.0000 mg | INTRAMUSCULAR | Status: DC | PRN
  Administered 2019-10-06: 1 mg via INTRAVENOUS
  Filled 2019-10-04: qty 1

## 2019-10-04 MED ORDER — GLYCOPYRROLATE 0.2 MG/ML IJ SOLN
0.2000 mg | INTRAMUSCULAR | Status: DC | PRN
  Administered 2019-10-04 – 2019-10-05 (×3): 0.2 mg via INTRAVENOUS
  Filled 2019-10-04 (×3): qty 1

## 2019-10-04 MED ORDER — SODIUM CHLORIDE 0.9 % IV SOLN: INTRAVENOUS | Status: DC

## 2019-10-04 MED ORDER — GLYCOPYRROLATE 0.2 MG/ML IJ SOLN: 0.2000 mg | INTRAMUSCULAR | Status: DC | PRN

## 2019-10-04 MED ORDER — POLYVINYL ALCOHOL 1.4 % OP SOLN
1.0000 [drp] | Freq: Four times a day (QID) | OPHTHALMIC | Status: DC | PRN
  Filled 2019-10-04: qty 15

## 2019-10-04 MED ORDER — MORPHINE SULFATE (PF) 2 MG/ML IV SOLN
1.0000 mg | INTRAVENOUS | Status: DC | PRN
  Administered 2019-10-04: 1 mg via INTRAVENOUS
  Filled 2019-10-04: qty 1

## 2019-10-04 NOTE — ED Notes (Signed)
Left eye 28mm non reactive. Right Eye 1 MM Reactive

## 2019-10-04 NOTE — Progress Notes (Signed)
NT thought no family had been contacted. I made call but no answer.  I later spoke with nurse and was told that the doctors had spoken with family . Chaplain available as needed.  Venida Jarvis, [place], [facility], Pager [phone]

## 2019-10-04 NOTE — ED Notes (Signed)
Placed on [facility]

## 2019-10-04 NOTE — ED Notes (Signed)
Back from CT

## 2019-10-04 NOTE — H&P (Addendum)
Date: 10/13/2019               Patient Name:  Philip [name] MRN: [id number]  DOB: 1951-02-05 Age / Sex: 69 y.o., male   PCP: System, Pcp Not In         Medical Service: Internal Medicine Teaching Service         Attending Physician: Dr. Evette Doffing, Mallie Mussel, *    First Contact: Dr. Marva Panda Pager: [phone]  Second Contact: Dr. Myrtie Hawk Pager: [phone]       After Hours (After 5p/  First Contact Pager: [phone]  weekends / holidays): Second Contact Pager: [phone]   Chief Complaint: seizure like activity  History of Present Illness: Philip [name] is a 69 yo M w/ a PMHx notable for CVA w/ right sided hemiparesis/aphasia, HTN, CKD and paroxysmal A-fib not on anticoagulation who presents to the emergency department via EMS for seizure-like activity.  The patient is a long-term resident of [facility] nursing facility.  Per EMS, patient had 2 seizures at the nursing home.  The patient appeared postictal on their arrival but had an additional 3 generalized seizures lasting at least 60 seconds each.  He was given 5 mg of IM Versed.  It was noted that his blood pressure was within normal limits and his blood sugar was in the 160s. Per staff from the facility the report the patient was last seen normal at approximately 2 AM.  At baseline he is almost entirely aphasic, is bedbound and requires a wheelchair for transportation.  At approximately 5 AM the patient appeared to be foaming at the mouth and shaking.  Due to the new seizure-like activity lasting at least 3 to 5 minutes the patient was given 1 mg of Ativan prior to EMS arrival blood pressure was noted to be in the 200s over the 100s.  They deny known falls.  In the ED, the patient was noted to have a large volume right sided intraventricular hemorrhage on CT of the head.  The ED provider, Dr. Leonides Schanz spoke with the patient's legal guardian, [name], the patient nephew on the phone.  Per her note, the family opted for comfort care measures given the  patient's declining functionality, poor baseline and the severity of his injury.  They stated that he would not wish to be intubated and he would not want surgery.  They opted to forego neurosurgical evaluation given the above.  As such admission for comfort care was requested.  Meds:  Current Meds  Medication Sig   acetaminophen (TYLENOL) 325 MG tablet Take 650 mg by mouth every 6 (six) hours as needed for fever or headache.   atorvastatin (LIPITOR) 40 MG tablet Take 40 mg at bedtime by mouth. (2100)   baclofen (LIORESAL) 10 MG tablet Take 10 mg 3 (three) times daily by mouth. (0900, 1700, 2100)   benazepril (LOTENSIN) 10 MG tablet Take 10 mg by mouth daily.   cloNIDine (CATAPRES) 0.1 MG tablet Take 0.1 mg by mouth 2 (two) times daily as needed (high blood pressure).    labetalol (NORMODYNE) 100 MG tablet Take 100 mg daily by mouth. (0900)   magic mouthwash SOLN Take 10 mLs by mouth daily as needed for mouth pain.   omeprazole (PRILOSEC) 40 MG capsule Take 40 mg 2 (two) times daily by mouth. (0600 & 1700)   Allergies: Allergies as of 09/27/2019   (No Known Allergies)   Past Medical History:  Diagnosis Date   Anemia  Aphasia    At high risk for falls    CVA (cerebral vascular accident) (HCC)    RIGHT HEMIPERESIS   Difficulty in walking    DYSPHAGIA 07/09/2008   Qualifier: Diagnosis of  By: Christella Hartigan MD, Melton Alar    Essential hypertension, benign    Essential hypertension, malignant    GERD (gastroesophageal reflux disease)    Hemiplegia affecting dominant side, late effect of cerebrovascular disease    Hemiplegia and hemiparesis following cerebral infarction affecting right dominant side (HCC)    Hyperlipidemia    Intracerebral hemorrhage (HCC)    Iron deficiency anemia    PAT (paroxysmal atrial tachycardia) (HCC)    NO ANTICOAGULATION DUE TO CEREBRAL HEMORRHAGE   Pneumonia    Risk for falls    Thyroid disease    Vitamin D deficiency    Family History: Unable to obtain due to  cerebral hemorrhage  Social History: Unable to obtain due to cerebral hemorrhage  Review of Systems: Unable to obtain due to cerebral hemorrhage  Physical Exam: Blood pressure (!) 151/101, [name] (!) 53, temperature (!) 96 F (35.6 C), temperature source Rectal, resp. rate (!) 23, SpO2 100 %. Physical Exam Constitutional:      General: He is in acute distress.     Appearance: He is underweight.  HENT:     Head: Atraumatic.     Mouth/Throat:     Mouth: Mucous membranes are moist.     Pharynx: No oropharyngeal exudate.  Eyes:     Comments: Negative dolls eyes Pupil sluggish with faint response to bright light  Cardiovascular:     Rate and Rhythm: Bradycardia present. Rhythm irregular.  Pulmonary:     Breath sounds: Wheezing, rhonchi and rales present.  Abdominal:     General: Abdomen is flat.     Palpations: Abdomen is soft.  Musculoskeletal:        General: No deformity or signs of injury.     Right lower leg: No edema.     Left lower leg: No edema.  Skin:    General: Skin is warm and dry.     Capillary Refill: Capillary refill takes less than 2 seconds.  Neurological:     Mental Status: He is unresponsive.     GCS: GCS eye subscore is 1. GCS verbal subscore is 1. GCS motor subscore is 5.    EKG: personally reviewed my interpretation is NSR, non-diagnostic insignificant ST elevation in V3  CT Head: Presence of a large right interventricular hemorrhage with midline shift  Assessment & Plan by Problem: Active Problems:   Non-traumatic intracerebral ventricular hemorrhage (HCC)  Assessment: Philip [name] is a 69 yo M w/ a PMHx notable for CVA w/ right sided hemiparesis/aphasia, HTN, CKD and paroxysmal A-fib not on anticoagulation who presents to the emergency department via EMS for seizure-like activity. He was subsequently found to have a large right sided interventricular hemorrhage most likely the source of his seizures. Per family request based on their understanding of  the patients wishes they opted for comfort care. The patients facility confirmed his code status per Dr. Barney Drain note.   Plan: Interventricular hemorrhage: Seizure: Hemorrhage induced seizure. Comfort care per family. I personally called and spoke with the patient's nephew, his legal guardian, who again confirms that his uncle would not want surgery or to be intubated.  They stated that in the past he had expressed his desire to be allowed to pass in peace.  They stated the patient would not want intubated  and confirmed that he would not want surgery and as such desired to forego a neurosurgery evaluation.  Given the family's desires, the patient's current condition, the patient's baseline status and comorbid illnesses I do not feel that opposition to their wishes is appropriate.  We will proceed with comfort care admission and consult to palliative care. Plan: -Consult to palliative care place -Comfort care orders placed: -Lorazepam 1 mg every 4 hours as needed for seizure -Morphine 1 mg IV every 2 hours for severe pain -Haloperidol stable 5 mg every 4 hours for agitation delirium -Continue gentle IV fluids  Diet: NPO Code: DNR DVT PPX: N/A-comfort care Dispo: Admit patient to Inpatient with expected length of stay greater than 2 midnights.  Signed: Lanelle Bal, MD 10/12/2019, 8:05 AM

## 2019-10-04 NOTE — ED Triage Notes (Signed)
Pt arrived [facility] from [facility] related to Seizures. Pt had 3 seizures on the way with EMS. EMS gave Midazolam  enroute.Marland Kitchen

## 2019-10-04 NOTE — ED Notes (Signed)
Per Dr. Elesa Massed Pt is DNR, DNI Comfort Care

## 2019-10-04 NOTE — ED Notes (Signed)
Off floor to CT 

## 2019-10-04 NOTE — ED Provider Notes (Signed)
TIME SEEN: 6:09 AM  CHIEF COMPLAINT: seizures  HPI: Patient is a 69 year old [place] male with history of CVA with right-sided hemiparesis and aphasia, hypertension, chronic kidney disease, paroxysmal atrial fibrillation not on anticoagulation who presents to the emergency department with EMS for seizures.  Patient lives at [facility] facility.  History provided by [facility] [name] staff.  Per EMS, patient had 2 seizures witnessed at the nursing home.  On arrival, patient was postictal.  He had another 3 generalized tonic-clonic seizures with EMS each lasting less than 60 seconds.  EMS reports that he appeared to have he did not return to baseline in between.  He was given 5 mg of IM Versed.  Normal blood pressure with EMS.  Blood sugar in the 160s.  Per Philip [name], nurse at [facility], patient was last seen normal at 2 AM.  He is mostly aphasic, wheelchair and bedbound.  She states that around 5 AM they went into the room to check on him and he appeared to be "foaming at the mouth" and "shaking".  She describes the seizures as generalized tonic-clonic.  She reports they lasted approximately 3 to 5 minutes each with several minutes in between.  He did not return to baseline between the seizures.  He has no known history of epilepsy per staff.  He was given 1 mg of Ativan prior to EMS arrival.  Blood pressure was in the 200s/100s at the nursing facility.  No known falls.  Per MAR, patient does not appear to be on antiplatelets or anticoagulants.  Philip [name] - nephew and guardian - [phone] Philip [name] - nephew's wife - [phone]  ROS: Level 5 caveat secondary to altered mental status  PAST MEDICAL HISTORY/PAST SURGICAL HISTORY:  Past Medical History:  Diagnosis Date  . Anemia   . Aphasia   . At high risk for falls   . CVA (cerebral vascular accident) ([place])    RIGHT HEMIPERESIS  . Difficulty in walking   . DYSPHAGIA 07/09/2008   Qualifier: Diagnosis  of  By: Ardis Hughs MD, Melene Plan   . Essential hypertension, benign   . Essential hypertension, malignant   . GERD (gastroesophageal reflux disease)   . Hemiplegia affecting dominant side, late effect of cerebrovascular disease   . Hemiplegia and hemiparesis following cerebral infarction affecting right dominant side ([place])   . Hyperlipidemia   . Intracerebral hemorrhage ([place])   . Iron deficiency anemia   . PAT (paroxysmal atrial tachycardia) (HCC)    NO ANTICOAGULATION DUE TO CEREBRAL HEMORRHAGE  . Pneumonia   . Risk for falls   . Thyroid disease   . Vitamin D deficiency     MEDICATIONS:  Prior to Admission medications   Medication Sig Start Date End Date Taking? Authorizing Provider  acetaminophen (TYLENOL) 500 MG tablet Take 1,000 mg 2 (two) times daily by mouth. (0900 & 2100)    [provider]  atorvastatin (LIPITOR) 40 MG tablet Take 40 mg at bedtime by mouth. (2100)    [provider]  baclofen (LIORESAL) 10 MG tablet Take 10 mg 3 (three) times daily by mouth. (0900, 1700, 2100)    [provider]  benazepril (LOTENSIN) 10 MG tablet Take 10 mg by mouth daily.    [provider]  cholecalciferol (VITAMIN D3) 25 MCG (1000 UT) tablet Take 1,000 Units by mouth daily.    [provider]  cloNIDine (CATAPRES) 0.1 MG tablet Take 0.1 mg by mouth 2 (two) times  daily.    [provider]  docusate sodium (COLACE) 100 MG capsule Take 100 mg by mouth 2 (two) times daily.    [provider]  ferrous sulfate 325 (65 FE) MG tablet Take 325 mg by mouth at bedtime.    [provider]  folic acid (FOLVITE) 1 MG tablet Take 1 mg by mouth daily.    [provider]  labetalol (NORMODYNE) 100 MG tablet Take 100 mg daily by mouth. (0900)    [provider]  Multiple Vitamins-Minerals (CERTAGEN PO) Take 1 tablet daily by mouth. (0900)    [provider]  Nutritional Supplements (RESOURCE PO) Take 60 mLs 3  (three) times daily by mouth. (0900, 1300, 1700)    [provider]  omeprazole (PRILOSEC) 40 MG capsule Take 40 mg 2 (two) times daily by mouth. (0600 & 1700)    [provider]    ALLERGIES:  No Known Allergies  SOCIAL HISTORY:  Social History   Tobacco Use  . Smoking status: Unknown If Ever Smoked  . Smokeless tobacco: Never Used  Substance Use Topics  . Alcohol use: No    FAMILY HISTORY: Family History  Family history unknown: Yes    EXAM: BP 126/88 (BP Location: Right Arm)   Pulse 99   Temp (!) 96 F (35.6 C) (Rectal)   Resp 20   SpO2 (!) 88%  CONSTITUTIONAL: Patient will have minimal movement of the left and right upper extremities to painful stimuli as well as the right lower extremity to painful stimuli.  He does not open his eyes or follow commands.  He does not talk. HEAD: Normocephalic, appears atraumatic EYES: Conjunctivae clear, pupils appear 2 to 3 mm and sluggishly reactive, no gaze deficit ENT: normal nose; moist mucous membranes NECK: Supple, normal ROM, no step-off or deformity noted CARD: RRR; S1 and S2 appreciated; no murmurs, no clicks, no rubs, no gallops RESP: Intermittent sonorous respirations, initially patient was satting in the upper 90s on room air but then dropped into the low 80s and had to be placed on a nonrebreather, lungs are clear to auscultation without rhonchi, wheezing or rales ABD/GI: Normal bowel sounds; non-distended; soft BACK:  The back appears normal EXT:  no deformity noted, no edema; no cyanosis, compartments are soft, extremities are warm and well perfused SKIN: Normal color for age and race; warm; no rash on exposed skin NEURO: Patient will have very minimal movement of bilateral hands and right foot to painful stimuli.  Does not answer questions or follow commands.  Keeps eyes closed.  MEDICAL DECISION MAKING: Patient here with multiple seizures.  He does not appear to be seizing currently but is altered.   Only minimal movement of the bilateral upper extremities and right foot to painful stimuli.  Initially he was satting well on room air but then dropped into the low 80s without any significant change in his mental status.  He was placed on nonrebreather and taken emergently to head CT.  CT of the head reviewed/interpreted and confirms large intraventricular bleed likely from the right basal ganglia.  Radiologist suspect this was a hypertensive bleed.  He was hypertensive per [facility] staff but his blood pressure is now normalized.  IV Keppra given in the emergency department for seizure prophylaxis.  Blood glucose here normal.  Labs, urine pending.  I had lengthy conversation by phone with patient's nephew Philip [name] and the nephew's wife Philip [name].  Philip [name] reports he is patient's guardian.  He states the  patient has no other family.  Discussed findings on imaging today.  Have offered to discuss case with neurosurgery as patient may benefit from ventriculostomy.  Given his tenuous airway status, I have recommended intubation as well.  They report the patient is a DNR which was also confirmed with the [facility].  They report that they do not feel the patient would not want intubation, ventilation and that he also would not want surgical intervention for this head bleed.  They do not feel that his quality of life at baseline was not very good due to his previous stroke which left him aphasic and wheelchair and bedbound.  They agreed to keeping patient here in the hospital and initiating comfort care measures.  PCP is at [facility].  Will discuss with medicine for admission.  Will consult palliative care.  ED PROGRESS: 7:24 AM Discussed patient's case with hospitalist, Dr. Katrinka Blazing.  I have recommended admission and patient (and family if present) agree with this plan. Admitting physician will place admission orders.   I reviewed all nursing notes, vitals, pertinent previous records and reviewed/interpreted all EKGs,  lab and urine results, imaging (as available).      EKG Interpretation  Date/Time:  Friday October 04 2019 06:06:06 EDT Ventricular Rate:  97 PR Interval:    QRS Duration: 83 QT Interval:  402 QTC Calculation: 511 R Axis:   55 Text Interpretation: Sinus rhythm Borderline T wave abnormalities Minimal ST elevation, anterior leads Prolonged QT interval No significant change since last tracing Confirmed by Rochele Raring [phone]) on 09/20/2019 6:09:41 AM        CRITICAL CARE Performed by: Baxter Hire Kewan Mcnease   Total critical care time: 55 minutes  Critical care time was exclusive of separately billable procedures and treating other patients.  Critical care was necessary to treat or prevent imminent or life-threatening deterioration.  Critical care was time spent personally by me on the following activities: development of treatment plan with patient and/or surrogate as well as nursing, discussions with consultants, evaluation of patient's response to treatment, examination of patient, obtaining history from patient or surrogate, ordering and performing treatments and interventions, ordering and review of laboratory studies, ordering and review of radiographic studies, pulse oximetry and re-evaluation of patient's condition.   Philip [name] was evaluated in Emergency Department on 10/14/2019 for the symptoms described in the history of present illness. He was evaluated in the context of the global COVID-19 pandemic, which necessitated consideration that the patient might be at risk for infection with the SARS-CoV-2 virus that causes COVID-19. Institutional protocols and algorithms that pertain to the evaluation of patients at risk for COVID-19 are in a state of rapid change based on information released by regulatory bodies including the CDC and federal and state organizations. These policies and algorithms were followed during the patient's care in the ED.      Philip [name], Philip Maw, DO 10/15/2019  [phone]

## 2019-10-04 NOTE — Progress Notes (Signed)
ABG done per MD order. Results given to MD.

## 2019-10-04 NOTE — ED Notes (Signed)
Pt titrated off of NRB by this RN and to nasal cannula. Pt 100% on 3L via [place] at present time. No distress noted.

## 2019-10-05 DIAGNOSIS — U071 COVID-19: Secondary | ICD-10-CM

## 2019-10-05 DIAGNOSIS — I4891 Unspecified atrial fibrillation: Secondary | ICD-10-CM

## 2019-10-05 DIAGNOSIS — J9601 Acute respiratory failure with hypoxia: Secondary | ICD-10-CM

## 2019-10-05 DIAGNOSIS — Z515 Encounter for palliative care: Principal | ICD-10-CM

## 2019-10-05 DIAGNOSIS — I615 Nontraumatic intracerebral hemorrhage, intraventricular: Principal | ICD-10-CM

## 2019-10-05 LAB — URINE CULTURE: Culture: NO GROWTH

## 2019-10-05 MED ORDER — MORPHINE 100MG IN NS 100ML (1MG/ML) PREMIX INFUSION
2.0000 mg/h | INTRAVENOUS | Status: DC
  Administered 2019-10-05: 1 mg/h via INTRAVENOUS
  Administered 2019-10-09: 5 mg/h via INTRAVENOUS
  Filled 2019-10-05 (×3): qty 100

## 2019-10-05 MED ORDER — KETOROLAC TROMETHAMINE 15 MG/ML IJ SOLN: 15.0000 mg | Freq: Four times a day (QID) | INTRAMUSCULAR | Status: DC

## 2019-10-05 MED ORDER — ACETAMINOPHEN 10 MG/ML IV SOLN
1000.0000 mg | Freq: Four times a day (QID) | INTRAVENOUS | Status: AC
  Administered 2019-10-05 – 2019-10-06 (×4): 1000 mg via INTRAVENOUS
  Filled 2019-10-05 (×4): qty 100

## 2019-10-05 MED ORDER — METHYLPREDNISOLONE SODIUM SUCC 40 MG IJ SOLR: 40.0000 mg | Freq: Two times a day (BID) | INTRAMUSCULAR | Status: DC

## 2019-10-05 MED ORDER — MORPHINE BOLUS VIA INFUSION
2.0000 mg | INTRAVENOUS | Status: DC | PRN
  Administered 2019-10-05 – 2019-10-06 (×5): 2 mg via INTRAVENOUS
  Filled 2019-10-05: qty 2

## 2019-10-05 MED ORDER — METHYLPREDNISOLONE SODIUM SUCC 125 MG IJ SOLR
INTRAMUSCULAR | Status: AC
  Filled 2019-10-05: qty 2

## 2019-10-05 MED ORDER — DEXAMETHASONE SODIUM PHOSPHATE 4 MG/ML IJ SOLN
4.0000 mg | Freq: Two times a day (BID) | INTRAMUSCULAR | Status: DC
  Administered 2019-10-05 – 2019-10-06 (×3): 4 mg via INTRAVENOUS
  Filled 2019-10-05 (×3): qty 1

## 2019-10-05 NOTE — Progress Notes (Signed)
   Subjective: Patient was seen and evaluated at bedside. He appears comfortable. Non verbal. Sedated and does not open his eyes when call his name.  Objective:  Vital signs in last 24 hours: Vitals:   10/05/19 1515 05-Oct-2019 1640 10/05/19 0615 10/05/19 0740  BP: 112/77 133/89 (!) 140/100 (!) 153/102  Pulse: 82 99 (!) 113   Resp:  (!) 22 (!) 21   Temp:   99.1 F (37.3 C) 100.1 F (37.8 C)  TempSrc:   Axillary Axillary  SpO2: 100% 98% 98%     Constitutional: Sedated, appears comfortable Cardiovascular:  RRR, nl S1S2, no murmur,  no LEE Respiratory: Effort normal, some rhonchi present  GI: Soft. Bowel sounds are normal. No distension. There is no tenderness.  Neurology: Sedated, does not open his eyes to verbal or tactile stimuli Skin: Not diaphoretic. No erythema.   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 fib, ischemic CVA, chronic right-sided hemiparesthesia and aphasia, brought to ED from SNF for seizure-like activity.  Unfortunately found to have extensive intraventricular hemorrhage originating from the right basal ganglia.  No surgical intervention planned per surrogate decision maker. Goal of care discussed on arrival and surrogate decision-maker decided comfort care.   Interventricular hemorrhage -Continue comfort measure -Patient appears comfortable on morphine drip -Continue as needed lorazepam for seizure -Continue Haldol 5 mg every 4 hours for agitation delirium -N.p.o. -Palliative care is on board.  Appreciate recommendation. Patient is likely a candidate for inpatient hospice.   Covid 19 infection  -On comfort measure -Continue airborne precautions  Prior to  Admission Living Arrangement: Anticipated Discharge Location: Barriers to Discharge: Dispo: Anticipated discharge inpatient hospice placement  Chevis Pretty, MD 10/05/2019, 4:09 PM Pager:

## 2019-10-05 NOTE — Consult Note (Signed)
Consultation Note Date: 10/05/2019   Patient Name: Philip [name]  DOB: 09-Apr-1951  MRN: [id number]  Age / Sex: 69 y.o., male  PCP: System, [place] Not In Referring Physician: Axel Filler, *  Reason for Consultation: Hospice Evaluation, Non pain symptom management, Pain control, Psychosocial/spiritual support and Terminal Care  HPI/Patient Profile: 69 y.o. male  with past medical history of CVA with residual dysphagia, aphagia, and right hemiparesis who was admitted on 10/01/2019 with seizure like symptoms and was found to have a devastating ICH as well as being COVID positive.  He was admitted for comfort care.    Of note the patient had Covid 19 infection several months ago.  Since that time he has had both vaccinations.  Family is questioning how he could possibly be positive on admission to [facility].  Clinical Assessment and Goals of Care:  I have reviewed medical records including EPIC notes, labs and imaging, received report from the attending MD, examined the patient and spoke with his nephew on the phone to discuss diagnosis prognosis, GOC, EOL wishes, disposition and options.  Of note patient's nephew is an Veterinary surgeon and speaks fluent English as does the nephew's wife Sonia Side.    We discussed the patient's current condition. He is slightly diaphoretic and tachycardic.  He appears to have a low grade temperature.  I discussed symptom management for these things with the family.    Hospice and Palliative Care services outpatient were explained and offered.  Family asked about Hospice at his nursing facility.  I recommended Hospice at [facility].  The family has had experience with Hospice facilities and greatly preferred that option for end of life care.   We discussed the visitation policy here as the nephew described some difficulty.  I confirmed the visitation policy with the floor RN.    Questions and concerns were addressed.  The family was encouraged to call with questions or concerns.     Primary Decision Maker:  LEGAL GUARDIAN nephew    SUMMARY OF RECOMMENDATIONS    1.  Reviewed current comfort orders and added morphine gtt with PRN boluses,  Decadron 4 mg bid for cerebral edema, IV tylenol for fever 2.  Talked with family regarding hospice facility.  Will place TOC order for placement at Hospice facility 3.  PMT will continue to round on [name] to assist with symptom management while he is in the hospital  Code Status/Advance Care Planning:  DNR   Additional Recommendations (Limitations, Scope, Preferences):  Full Comfort Care.   Unrestricted visitation status to be directed by floor RN.  Palliative Prophylaxis:   Frequent Pain Assessment  Psycho-social/Spiritual:   Desire for further Chaplaincy support:  Not discussed.  Prognosis:  Hours to days.    Discharge Planning: Hospice facility vs inpatient death.      Primary Diagnoses: Present on Admission: **None**   I have reviewed the medical record, interviewed the patient and family, and examined the patient. The following aspects are pertinent.  Past Medical History:  Diagnosis Date  .  Anemia   . Aphasia   . At high risk for falls   . CVA (cerebral vascular accident) (HCC)    RIGHT HEMIPERESIS  . Difficulty in walking   . DYSPHAGIA 07/09/2008   Qualifier: Diagnosis of  By: Christella Hartigan MD, Melton Alar   . Essential hypertension, benign   . Essential hypertension, malignant   . GERD (gastroesophageal reflux disease)   . Hemiplegia affecting dominant side, late effect of cerebrovascular disease   . Hemiplegia and hemiparesis following cerebral infarction affecting right dominant side (HCC)   . Hyperlipidemia   . Intracerebral hemorrhage (HCC)   . Iron deficiency anemia   . PAT (paroxysmal atrial tachycardia) (HCC)    NO ANTICOAGULATION DUE TO CEREBRAL HEMORRHAGE  . Pneumonia   . Risk  for falls   . Thyroid disease   . Vitamin D deficiency    Social History   Socioeconomic History  . Marital status: Single    Spouse name: Not on file  . Number of children: 0  . Years of education: Not on file  . Highest education level: Not on file  Occupational History  . Occupation: disabled  Tobacco Use  . Smoking status: Unknown If Ever Smoked  . Smokeless tobacco: Never Used  Substance and Sexual Activity  . Alcohol use: No  . Drug use: No  . Sexual activity: Not on file  Other Topics Concern  . Not on file  Social History Narrative   Lives at [facility]   Married    Birth Place [place]    DNR   Social Determinants of Health   Financial Resource Strain:   . Difficulty of Paying Living Expenses:   Food Insecurity:   . Worried About Programme researcher, broadcasting/film/video in the Last Year:   . Barista in the Last Year:   Transportation Needs:   . Freight forwarder (Medical):   Marland Kitchen Lack of Transportation (Non-Medical):   Physical Activity:   . Days of Exercise per Week:   . Minutes of Exercise per Session:   Stress:   . Feeling of Stress :   Social Connections:   . Frequency of Communication with Friends and Family:   . Frequency of Social Gatherings with Friends and Family:   . Attends Religious Services:   . Active Member of Clubs or Organizations:   . Attends Banker Meetings:   Marland Kitchen Marital Status:    Family History  Family history unknown: Yes   Scheduled Meds: Continuous Infusions: . sodium chloride 20 mL/hr at 09/25/2019 1830  . chlorproMAZINE (THORAZINE) IV     PRN Meds:.chlorproMAZINE (THORAZINE) IV, glycopyrrolate **OR** glycopyrrolate **OR** glycopyrrolate, haloperidol **OR** haloperidol **OR** haloperidol lactate, LORazepam, morphine injection, polyvinyl alcohol No Known Allergies    Vital Signs: BP (!) 153/102 (BP Location: Left Arm)   Pulse (!) 113   Temp 100.1 F (37.8 C) (Axillary)   Resp (!) 21   SpO2 98%  Pain Scale: Faces        SpO2: SpO2: 98 % O2 Device:SpO2: 98 % O2 Flow Rate: .O2 Flow Rate (L/min): 2 L/min  IO: Intake/output summary:   Intake/Output Summary (Last 24 hours) at 10/05/2019 0935 Last data filed at 10/05/2019 7619 Gross per 24 hour  Intake 430.25 ml  Output --  Net 430.25 ml    LBM: Last BM Date: (PTA) Baseline Weight:   Most recent weight:       Palliative Assessment/Data: 10%     Time In: 11:00 Time  Out: 12:00 Time Total: 60 min. Visit consisted of counseling and education dealing with the complex and emotionally intense issues surrounding the need for palliative care and symptom management in the setting of serious and potentially life-threatening illness. Greater than 50%  of this time was spent counseling and coordinating care related to the above assessment and plan.  Signed by: Norvel Richards, PA-C Palliative Medicine  Please contact Palliative Medicine Team phone at [phone] for questions and concerns.  For individual provider: See Loretha Stapler

## 2019-10-05 NOTE — TOC Initial Note (Signed)
Transition of Care [facility]) - Initial/Assessment Note    Patient Details  Name: Philip [name] MRN: [id number] Date of Birth: 04-28-1951  [facility] CM/SW Contact:    Donnie Coffin, LCSW Phone Number: 10/05/2019, 3:26 PM  Clinical Narrative:                  CSW spoke with patients nephew, Luberta Robertson, regarding hospice home placement- patient is currently covid + and only option is [facility]. Referral sent to [facility] with facility however per Pam family was not thrilled about traveling to [facility]. CSW reached out to [facility] and their [facility] locations are not accepting covid + patients at this time. CSW will follow up with family.   Expected Discharge Plan: Hospice Medical Facility Barriers to Discharge: Hospice Bed not available   Patient Goals and CMS Choice   CMS Medicare.gov Compare Post Acute Care list provided to:: Patient Represenative (must comment) Choice offered to / list presented to : [facility] POA / Guardian  Expected Discharge Plan and Services Expected Discharge Plan: Hospice Medical Facility     Post Acute Care Choice: Hospice                                        Prior Living Arrangements/Services                       Activities of Daily Living      Permission Sought/Granted                  Emotional Assessment              Admission diagnosis:  Seizures (HCC) [R56.9] IVH (intraventricular hemorrhage) (HCC) [I61.5] Acute respiratory failure with hypoxia (HCC) [J96.01] Non-traumatic intracerebral ventricular hemorrhage (HCC) [I61.5] Patient Active Problem List   Diagnosis Date Noted  . Acute respiratory failure with hypoxia (HCC)   . IVH (intraventricular hemorrhage) (HCC)   . Non-traumatic intracerebral ventricular hemorrhage (HCC) 09/26/2019  . Palliative care encounter 09/30/2019  . Microcytic hypochromic anemia 02/03/2017  . DNR (do not resuscitate) 01/10/2015  . Hyperlipemia 09/30/2013   . Anemia 08/26/2013  . Throat pain 07/01/2013  . Essential hypertension, benign 03/05/2013  . GERD (gastroesophageal reflux disease) 03/05/2013  . Hemiplegia of dominant side as late effect following cerebrovascular disease (HCC) 03/05/2013  . Vitamin D deficiency 03/05/2013  . Dysphagia 07/09/2008   PCP:  System, Pcp Not In Pharmacy:  No Pharmacies Listed    Social Determinants of Health (SDOH) Interventions    Readmission Risk Interventions No flowsheet data found.

## 2019-10-06 DIAGNOSIS — R569 Unspecified convulsions: Secondary | ICD-10-CM

## 2019-10-06 MED ORDER — LORAZEPAM 2 MG/ML IJ SOLN
2.0000 mg | Freq: Once | INTRAMUSCULAR | Status: AC
  Administered 2019-10-06: 2 mg via INTRAVENOUS
  Filled 2019-10-06: qty 1

## 2019-10-06 MED ORDER — LORAZEPAM 2 MG/ML IJ SOLN
1.0000 mg | Freq: Four times a day (QID) | INTRAMUSCULAR | Status: DC
  Administered 2019-10-06 – 2019-10-07 (×2): 1 mg via INTRAVENOUS
  Filled 2019-10-06 (×2): qty 1

## 2019-10-06 MED ORDER — LEVETIRACETAM IN NACL 500 MG/100ML IV SOLN: 500.0000 mg | Freq: Two times a day (BID) | INTRAVENOUS | Status: DC

## 2019-10-06 MED ORDER — CHLORHEXIDINE GLUCONATE 0.12 % MT SOLN
15.0000 mL | Freq: Two times a day (BID) | OROMUCOSAL | Status: DC
  Administered 2019-10-06 – 2019-10-09 (×7): 15 mL via OROMUCOSAL
  Filled 2019-10-06 (×7): qty 15

## 2019-10-06 MED ORDER — ORAL CARE MOUTH RINSE
15.0000 mL | Freq: Two times a day (BID) | OROMUCOSAL | Status: DC
  Administered 2019-10-06 – 2019-10-09 (×8): 15 mL via OROMUCOSAL

## 2019-10-06 MED ORDER — CHLORHEXIDINE GLUCONATE CLOTH 2 % EX PADS
6.0000 | MEDICATED_PAD | Freq: Every day | CUTANEOUS | Status: DC
  Administered 2019-10-07 – 2019-10-09 (×4): 6 via TOPICAL

## 2019-10-06 MED ORDER — MORPHINE BOLUS VIA INFUSION
2.0000 mg | INTRAVENOUS | Status: DC | PRN
  Administered 2019-10-06 – 2019-10-08 (×5): 2 mg via INTRAVENOUS
  Filled 2019-10-06: qty 4

## 2019-10-06 MED ORDER — LEVETIRACETAM IN NACL 1000 MG/100ML IV SOLN
1000.0000 mg | Freq: Two times a day (BID) | INTRAVENOUS | Status: DC
  Administered 2019-10-06 – 2019-10-09 (×7): 1000 mg via INTRAVENOUS
  Filled 2019-10-06 (×8): qty 100

## 2019-10-06 MED ORDER — LORAZEPAM 2 MG/ML IJ SOLN: 1.0000 mg | INTRAMUSCULAR | Status: DC | PRN

## 2019-10-06 NOTE — TOC Progression Note (Signed)
Transition of Care [facility]) - Progression Note    Patient Details  Name: Philip [name] MRN: [id number] Date of Birth: 17-Dec-1950  Transition of Care [facility]) CM/SW [place], [place] Phone Number: 10/06/2019, 10:02 AM  Clinical Narrative:     CSW received a phone call from [place] with [facility]. She wanted to know if the patient would be coming to their facility. CSW stated she would have to follow up with the family.   CSW called patient's nephew, Leodis Liverpool and called and spoke with him. He had met with Haynes Dage at bedside yesterday. Family expressed interest in wanting a hospital death, they are declining [facility] due to it being too far. [facility] is the only residential hospice facility that is accepting COVID positive patients.   CSW called and spoke with the MD and relayed the families wishes. Plan is to anticipate a hospital death.     Expected Discharge Plan: [place] Barriers to Discharge: Hospice Bed not available  Expected Discharge Plan and Services Expected Discharge Plan: [place]     Post Acute Care Choice: Hospice                                         Social Determinants of Health (SDOH) Interventions    Readmission Risk Interventions No flowsheet data found.

## 2019-10-06 NOTE — Progress Notes (Signed)
Daily Progress Note   Patient Name: Philip [name]       Date: 10/06/2019 DOB: 13-May-1951  Age: 69 y.o. MRN#: [id number] Attending Physician: Philip [name], * Primary Care Physician: System, Pcp Not In Admit Date: 2019/11/02  Reason for Consultation/Follow-up:  Terminal care, symptom management  Assessment: Patient actively dying.  As I enter the room and speak with him he lifts his right arm into the air and begins extend and flex his forearm in a rhythmic motion.  His jaw begins to move with the same rapid motion.  He calms and then has hiccups, and then he starts the motion with his arm and chin again.    Discussed with RN.  Patient did this earlier in the day and responded well to ativan.   RN requests a foley be placed as the condom cath does not stay in place.  Spoke with Niece and Nephew.  Provided update on apparent seizure activity.  They responded that he was doing that when he came in to the hospital.  Family was quite concerned.  They do not want this to continue.  We discussed utilizing Palliative sedation if we are unable to control the seizures with scheduled symptom management.  Family was grateful.  Patient Profile/HPI:  69 y.o. male  with past medical history of CVA with residual dysphagia, aphagia, and right hemiparesis who was admitted on 11-02-2019 with seizure like symptoms and was found to have a devastating ICH as well as being COVID positive.  He was admitted for comfort care.    Of note the patient had Covid 19 infection several months ago.  Since that time he has had both vaccinations.  Family is questioning how he could possibly be positive on admission to [facility].    Length of Stay: 2   Vital Signs: BP (!) 153/102 (BP Location: Left Arm)   Pulse 89   Temp  100.1 F (37.8 C) (Axillary)   Resp 18   SpO2 92%  SpO2: SpO2: 92 % O2 Device: O2 Device: Room Air O2 Flow Rate: O2 Flow Rate (L/min): 1 L/min       Palliative Assessment/Data: 10%     Palliative Care Plan    Recommendations/Plan:   Concern for seizures.  Gave ativan 2 mg once and scheduled ativan 1 mg q 6 hours  with a PRN for seizures.  Will initiate keppra q 12 hours.  If he continues to seize I would have a low threshold to start phenobarbital gtt.  Family agreeable to palliative sedation if necessary.  They do not want him seizing.  At RN request placed order for foley cath  Increased range and PRN orders for morphine gtt to help ensure comfort  Code Status:  DNR  Prognosis:   Hours - Days   Discharge Planning:  Anticipated Hospital Death  Care plan was discussed with RN  Thank you for allowing the Palliative Medicine Team to assist in the care of this patient.  Total time spent:  35 min.     Greater than 50%  of this time was spent counseling and coordinating care related to the above assessment and plan.  Philip Richards, PA-C Palliative Medicine  Please contact Palliative MedicineTeam phone at [phone] for questions and concerns between 7 am - 7 pm.   Please see AMION for individual provider pager numbers.

## 2019-10-06 NOTE — Progress Notes (Signed)
   Subjective: Patient was seen and evaluated at bedside on morning rounds. He appears comfortable. He opens his eyes to verbal + tactile stimuli. Does not follow commands.   Objective:  Vital signs in last 24 hours: Vitals:   10/06/19 0100 10/06/19 0200 10/06/19 0230 10/06/19 0400  BP:      Pulse: 97 (!) 101  89  Resp:  18 18   Temp:      TempSrc:      SpO2: 95% 94%  92%   Constitutional: Appears comfortable. Opens eyes with verbal + tactile stimuli Cardiovascular: RRR, nl S1S2, no murmur,  no LEE Respiratory: Effort normal and breath sounds normal. No respiratory distress. No wheezes or crackles on anterior and lateral lung exam. GI: No distension.  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 fib, ischemic CVA, chronic right-sided hemiparesthesia and aphasia, brought to ED from SNF for seizure-like activity.  Unfortunately found to have extensive intraventricular hemorrhage originating from the right basal ganglia.  No surgical intervention planned per surrogate decision maker. Goal of care discussed on arrival and surrogate decision-maker decided comfort care.  Intraventricular hemorrhage: -Currently on comfort measure -Patient opens his eyes in respond to tactile stimuli but does not follow commands -Palliative care is on board and assisting with comfort measure as discussed with surrogate decision-maker  -Per social worker, family decided not to proceed with transferring to inpatient hospice and continue comfort measure in hospital.  Appreciate palliative care follow-up  COVID-19 infection: -Continue airborne and contact precautions -On comfort measure   Chevis Pretty, MD 10/06/2019, 12:46 PM Pager: [phone]

## 2019-10-07 MED ORDER — LORAZEPAM BOLUS VIA INFUSION
1.0000 mg | INTRAVENOUS | Status: DC | PRN
  Filled 2019-10-07: qty 2

## 2019-10-07 MED ORDER — LORAZEPAM 2 MG/ML IJ SOLN
2.0000 mg/h | INTRAVENOUS | Status: DC
  Administered 2019-10-07: 0.5 mg/h via INTRAVENOUS
  Administered 2019-10-08 – 2019-10-09 (×2): 2 mg/h via INTRAVENOUS
  Filled 2019-10-07 (×8): qty 25

## 2019-10-07 NOTE — Progress Notes (Signed)
Nutrition Brief Note  RD working remotely. Chart reviewed. Pt now transitioning to comfort care.  No further nutrition interventions warranted at this time.  Please re-consult as needed.   Lavenia Stumpo W, RD, LDN, CDCES Registered Dietitian II Certified Diabetes Care and Education Specialist Please refer to AMION for RD and/or RD on-call/weekend/after hours pager  

## 2019-10-07 NOTE — Progress Notes (Addendum)
Palliative follow up regarding complex symptom management.  Discussed with RN - Patient had an episode of total body tremors overnight.  When day bedside RN came on patient was having frequent hiccups.  Curbsided Dr. Melynda Ripple of Neurology regarding seizures.  She gave recommendations regarding ativan drip.  We also discussed phenobarbital dosing (1000 mg) if seizures continue.  Ativan gtt started and dose adjusted.  Per RN patient has had no seizures and appeared comfortable since.  Please call Palliative [phone] for orders if patient has further seizures.  Norvel Richards, PA-C Palliative Medicine Office:  [phone]  15 min.  Greater than 50%  of this time was spent counseling and coordinating care related to the above assessment and plan.

## 2019-10-07 NOTE — Progress Notes (Signed)
Despite scheduled ativan, PRN ativan and scheduled Keppra,  patient had an episode of full body tremors overnight.  Family is very concerned about his comfort at end of life.  I will request a low dose ativan drip be initiated in addition to PRN ativan to provide more continuous protection from seizures.  Norvel Richards, PA-C Palliative Medicine Office:  [phone]

## 2019-10-07 NOTE — Progress Notes (Signed)
   Subjective: Patient was seen and evaluated at bedside on morning rounds. Reported to have generalized tremor, seizure like moving over night. He now appears comfortable and not actively seizing.   Objective:  Vital signs in last 24 hours: Vitals:   10/06/19 0200 10/06/19 0230 10/06/19 0400 10/07/19 0538  BP:    (!) 147/90  Pulse: (!) 101  89 (!) 118  Resp: 18 18  16   Temp:    (!) 100.5 F (38.1 C)  TempSrc:    Axillary  SpO2: 94%  92% 92%   Constitutional: Appears comfortable. Respiratory: No respiratory distress.  GI:No distension. Neurological: Does not open his eyes with verbal stimuli  Assessment/Plan:  Principal Problem:   Non-traumatic intracerebral ventricular hemorrhage (HCC) Active Problems:   Hemiplegia of dominant side as late effect following cerebrovascular disease (HCC)   Palliative care encounter   Acute respiratory failure with hypoxia (HCC)   IVH (intraventricular hemorrhage) (HCC)   Convulsions (HCC)  69 year old male, with A. fib, ischemic CVA, chronic right-sided hemiparesthesia and aphasia, brought to ED from SNF for seizure-like activity. Unfortunately found to have extensive intraventricular hemorrhage originating from the right basal ganglia. No surgical intervention planned per surrogate decision maker. Goal of care discussed on arrival and surrogate decision-maker decided comfort care.   Intraventricular hemorrhage: Had episode of seizure overnight.  -Started on Ativan drip per neuro -On Morphine drip -On Keppra 1000 mg IV  q12h -Will load phenobarb if another seizure activity -Continue comfort measure -On PRN Robinul, Haldol -Patient opens his eyes in respond to tactile stimuli but does not follow commands -Palliative care is on board, appreciate f/u  COVID-19 infection: -Continue airborne and contact precautions -On comfort measure   Prior to Admission Living Arrangement: Dispo: Anticipated in hospital death. On comfort  care  79, MD 10/07/2019, 12:01 PM Pager: @MYPAGER @

## 2019-10-08 NOTE — Progress Notes (Signed)
   Subjective:   Patient seen at the bedside this morning on rounds.  Patient remains unresponsive to the call of his name or noxious stimuli.  He appears to be comfortable and not in respiratory distress.  No noted seizure activity overnight.  Objective:  Vital signs in last 24 hours: Vitals:   10/07/19 0538 10/07/19 1203 10/08/19 0600 10/08/19 0841  BP: (!) 147/90 123/83 (!) 84/67 (!) 83/70  Pulse: (!) 118 (!) 124 66 (!) 124  Resp: 16 18 20 14   Temp: (!) 100.5 F (38.1 C) 99.7 F (37.6 C) 99.4 F (37.4 C) 97.6 F (36.4 C)  TempSrc: Axillary Axillary Axillary Axillary  SpO2: 92% 90%  (!) 89%   General: Appears comfortable, resting CV: Tachycardic, normal S1-S2, no murmurs rubs or gallops appreciated PULM: Clear to auscultation bilaterally, normal work of breathing ABD: Soft abdomen NEURO: Unresponsive to stimuli  Assessment/Plan:  Principal Problem:   Non-traumatic intracerebral ventricular hemorrhage (HCC) Active Problems:   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 fib, ischemic CVA, chronic right-sided hemiparesthesia and aphasia, brought to ED from SNF for seizure-like activity. Unfortunately found to have extensive intraventricular hemorrhage originating from the right basal ganglia. No surgical intervention planned per surrogate decision maker. Goal of care discussed on arrival and surrogate decision-maker decided comfort care.  #Intraventricular hemorrhage -Started on Ativan drip per neuro -On Morphine drip -On Keppra 1000 mg IV  q12h -Will load phenobarb if another seizure activity -Continue comfort measure -On PRN Robinul, Haldol -Patient opens his eyes in respond to tactile stimuli but does not follow commands -Palliative care is  on board, appreciate f/u  #COVID-19 infection: -Continue airborne and contact precautions -On comfort measure  #Dispo: Anticipated in hospital death. On comfort care  79, MD Internal Medicine, PGY1 Pager: [phone]  10/08/2019,11:48 AM

## 2019-10-08 NOTE — Progress Notes (Signed)
Pt did well overnight.  Regular, unlabored, respirations.  Minimal secretions.  Pt turned to either side q2 hours.  No tremors or seizure-like activity witnessed.

## 2019-10-08 NOTE — Progress Notes (Signed)
Palliative:  HPI: 69 y.o. male  with past medical history of CVA with residual dysphagia, aphagia, and right hemiparesis who was admitted on 10/02/2019 with seizure like symptoms and was found to have a devastating ICH as well as being COVID positive.  He was admitted for comfort care.  I went to Mr. Barstow bedside but no family at bedside. He appears to be resting comfortably. Breathing regular, unlabored on morphine and ativan infusions. Muscles and jaw becoming more relaxed. Extremities warm without mottling. Discussed with bedside RN who reports no further seizure activity or issues.   I called and spoke with niece, Bo Merino, who RN reports had called. Bo Merino has good understanding that Mr. Mcintyre is at end of life and prognosis likely days at this stage. She would like to be notified if we see signs of this changing and his time getting closer. She reports that her husband has been able to visit with Mr. Gruenwald and was at his bedside today briefly. All questions/concerns addressed. Emotional support provided.   Exam: Unresponsive. No distress. Breathing regular, unlabored, no apnea noted. Extremities warm to touch without mottling/cyanosis.   Plan: - Anticipate hospital death.  - Continue medications to ensure comfort - no changes today.  - Phenobarbital to be added for breakthrough seizure activity.   25 min  Yong Channel, NP Palliative Medicine Team Pager [phone] (Please see [email or web address] for schedule) Team Phone [phone]    Greater than 50%  of this time was spent counseling and coordinating care related to the above assessment and plan

## 2019-10-09 MED ORDER — GLYCOPYRROLATE 0.2 MG/ML IJ SOLN
0.4000 mg | Freq: Four times a day (QID) | INTRAMUSCULAR | Status: DC
  Administered 2019-10-09 (×3): 0.4 mg via INTRAVENOUS
  Filled 2019-10-09 (×3): qty 2

## 2019-10-10 NOTE — Progress Notes (Signed)
Wasted 70 ml of Morphine & 30 mL of ativan. Witnessed by Clyda Greener, RN.

## 2019-10-19 NOTE — Progress Notes (Signed)
[facility] paged & family called. Family to come visit patient.

## 2019-10-19 NOTE — Progress Notes (Signed)
   Subjective: HD# 6. Patient was seen and evaluated at bedside on morning rounds. No acute events overnight.  He appears comfortable.  No response to stimuli  Objective:  Vital signs in last 24 hours: Vitals:   10/08/19 0600 10/08/19 0841 09/19/2019 0416 10/02/2019 0739  BP: (!) 84/67 (!) 83/70 123/87 103/74  Pulse: 66 (!) 124 (!) 126 (!) 117  Resp: 20 14 16 14   Temp: 99.4 F (37.4 C) 97.6 F (36.4 C) 99 F (37.2 C) 99 F (37.2 C)  TempSrc: Axillary Axillary Axillary Axillary  SpO2:  (!) 89% (!) 81% (!) 82%   General: Patient appears comfortable,  CV: Tachycardic, no murmur Pulm: Normal work of breathing, no respiratory distress, no crackles on anterior and lateral lung exam Abdomen: Not distended Neuro: No response to stimuli  Assessment/Plan:  Principal Problem:   Non-traumatic intracerebral ventricular hemorrhage (HCC) Active Problems:   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 fib, ischemic CVA, chronic right-sided hemiparesthesia and aphasia, brought to ED from SNF for seizure-like activity. Unfortunately found to have extensive intraventricular hemorrhage originating from the right basal ganglia. No surgical intervention planned per surrogate decision maker. Goal of care discussed on arrival and surrogate decision-maker decided comfort care.  Intraventricular hemorrhage:  HP #6 -Patient is on comfort care -Patient is on morphine drip, Ativan drip, as needed Robinul, as needed Haldol -Continue IV Keppra 1000 mg twice daily -We will load phenobarbital if more seizure -patient's family willing not to proceed with inpatient hospice, and instead keeping hospitalized anticipating in hospital death -Palliative care is following.  Appreciate  recommendation  COVID-19 infection: -Patient is on comfort care -Continue airborne and contact precaution  Prior to Admission Living Arrangement: Anticipated Discharge Location: Barriers to Discharge: Dispo: Anticipated in hospital death  79, MD 09/26/2019, 11:05 AM Pager: @MYPAGER @

## 2019-10-19 NOTE — Progress Notes (Signed)
Palliative:  HPI: 69 y.o.malewith past medical history of CVA with residual dysphagia, aphagia, and right hemiparesiswho was admitted on 4/16/2021with seizure like symptoms and was found to have adevastatingICH as well as being COVID positive. He was admitted for comfort care.   I met today at Philip [name] bedside. No family currently present. He has progressed since yesterday with muscles more relaxed, breathing more shallow with audible secretions/gurgling and oxygen levels decreasing. Feet are cold to touch. I have ordered scheduled Robinul to try and get ahead of secretions. No further seizures. Overall appears comfortable.   I called and spoke to niece, Roland Earl. I explained to her the signs I am seeing for progression at end of life. Educated that I would not be surprised if he were to die in the next 24-48 hours. I feel his time is getting closer and encouraged visitation (per guidelines) as desired. All questions/concerns addressed. Emotional support provided. Discussed plan with RN.   Exam: Unresponsive. Pale. Muscles relaxed and drawn back. Shallow breathes with audible secretions. No apnea. Abd flat/soft. Bilat feet cold to touch with slight cyanosis.   Plan: - Progressing at end of life. Prognosis hours to days.  - Anticipate hospital death.  - Scheduled Robinul for secretions.   [place], NP Palliative Medicine Team Pager [phone] (Please see [email or web address] for schedule) Team Phone [phone]    Greater than 50%  of this time was spent counseling and coordinating care related to the above assessment and plan

## 2019-10-19 NOTE — Progress Notes (Signed)
Patient given bath. IVs & foley removed.

## 2019-10-19 NOTE — Progress Notes (Signed)
Family at bedside. 

## 2019-10-19 NOTE — Progress Notes (Signed)
Patient found deceased at bedside. Verified death with Oley Balm, RN & Clyda Greener, RN.

## 2019-10-19 NOTE — Death Summary Note (Signed)
  Name: Zeplin Aleshire MRN: [id number] DOB: Aug 15, 1950 69 y.o.  Date of Admission: 09/19/2019  6:05 AM Date of Discharge: 10/18/2019 Attending Physician: Dr. 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 COVID-19 Time of death: 8:09 PM  Disposition and follow-up:   Mr.Press Homen was discharged from [facility] in expired condition.    Hospital Course: Mr. Shari Prows is a 69 year old male medical history of ischemic CVA with residual right-sided hemiparesis and aphasia, A. fib, brought to the ED from SNF due to seizure-like activity. He was found to have extensive intraventricular hemorrhage from right basal ganglia on head CT. He was unresponsive. Finding discussed with family, and they chose to proceed with comfort care. per surrogate decision maker, the patient would not want surgical intervention and decision made to proceed with comfort measure given devastating cerebral hemorrhage.  Palliative care consulted and started comfort measure.  He received twice daily IV Keppra, morphine drip, Robinol and PRN Haldol. Patient had another episode of seizure during hospitalization for which, started on Ativan drip. Care team made sure that patient remains comfortable. He passed away on hospital day# 7. Family notified per nursing staff.   Signed: Chevis Pretty, MD 09/28/2019, 7:57 AM

## 2019-10-19 DEATH — deceased

## 2020-12-14 IMAGING — CT CT HEAD W/O CM
4 series · 15 of 47 positions shown, 17 images · non-contrast
Comparison: 11/19/2007.

CLINICAL DATA: New onset seizure

EXAM:
CT HEAD WITHOUT CONTRAST
TECHNIQUE: Contiguous axial images were obtained from the base of the skull
through the vertex without intravenous contrast.

[Series 3: head without · axial · non-contrast · 0.46mm/px · z∈[-114,-14]mm · 6 of 29 slices shown, 8 images]
[im 5/29  brain]
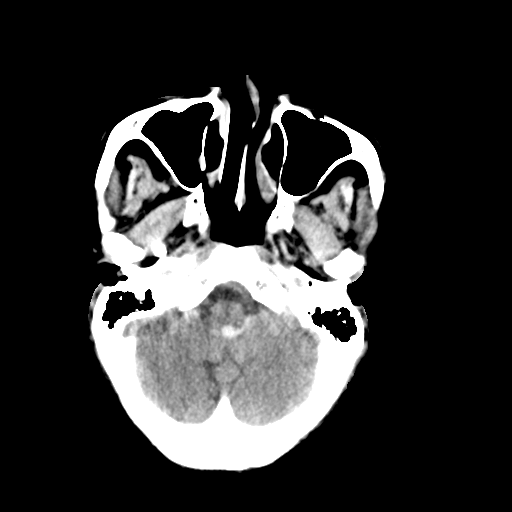
[im 5/29  bone]
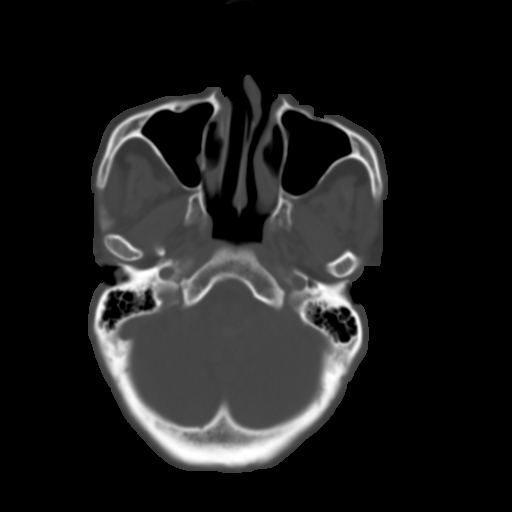
[im 9/29  brain]
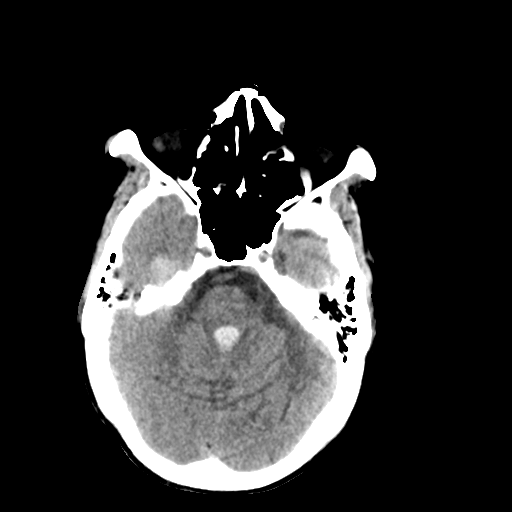
[im 13/29  brain]
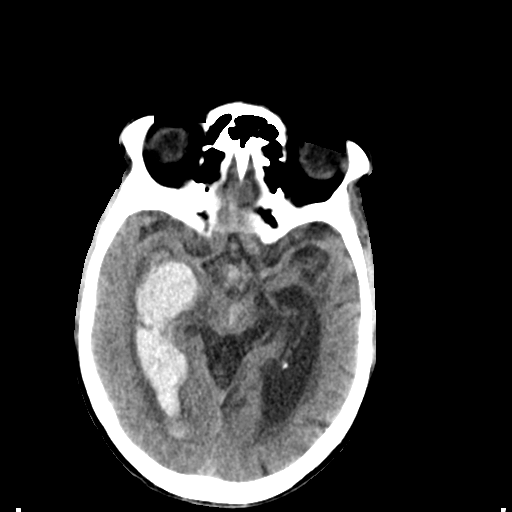
[im 17/29  brain]
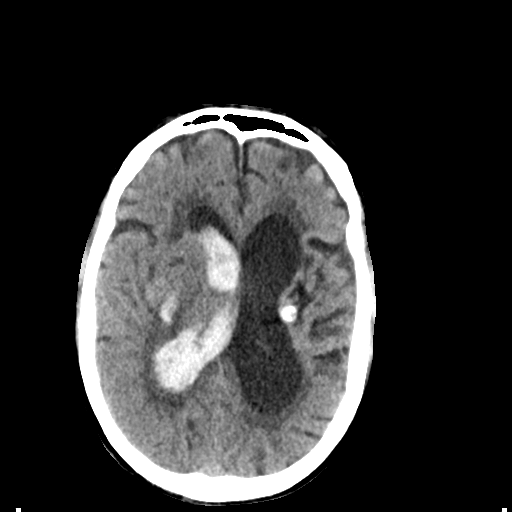
[im 21/29  brain]
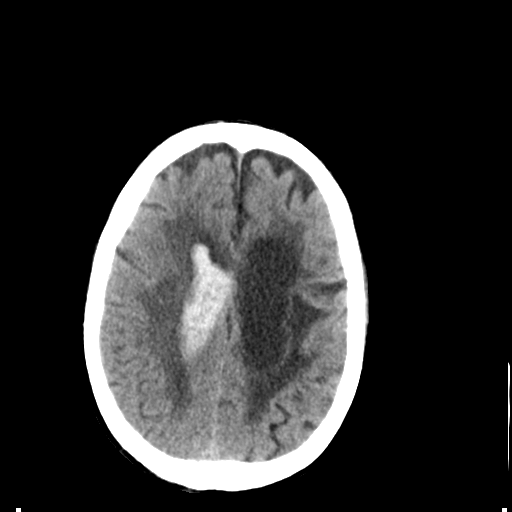
[im 21/29  bone]
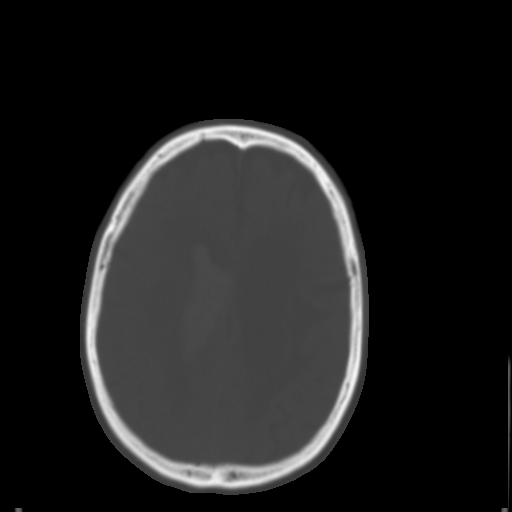
[im 25/29  brain]
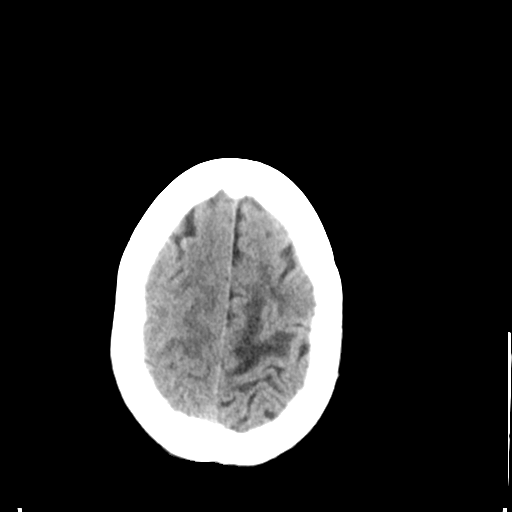

[Series 4: head bone · axial · 0.50mm/px · z∈[-120,-82]mm · 3 of 80 slices shown]
[im 8/80  bone]
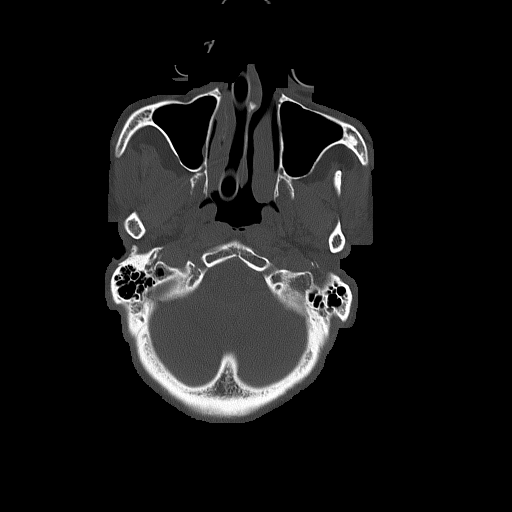
[im 16/80  bone]
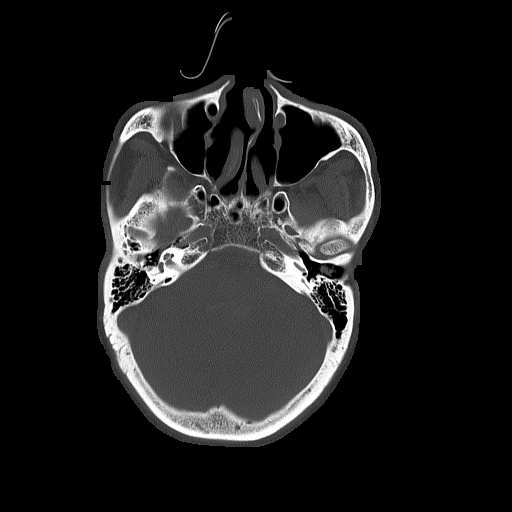
[im 27/80  bone]
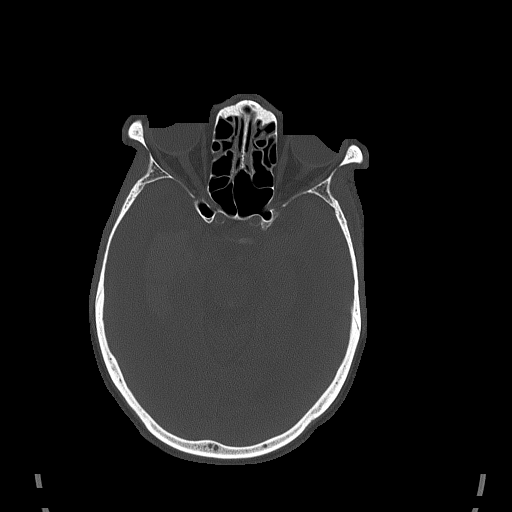

[Series 5: head without cor · coronal · non-contrast · 0.30mm/px · 3 of 72 slices shown]
[im 24/72  brain]
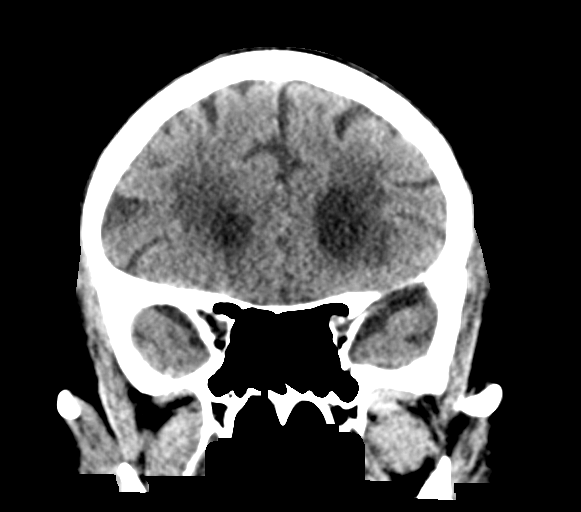
[im 32/72  brain]
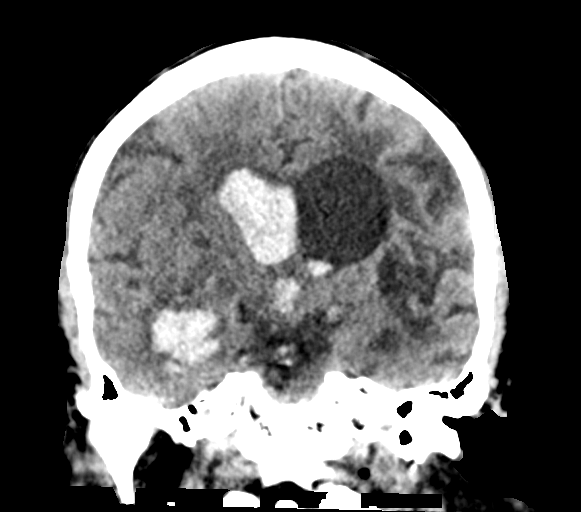
[im 40/72  brain]
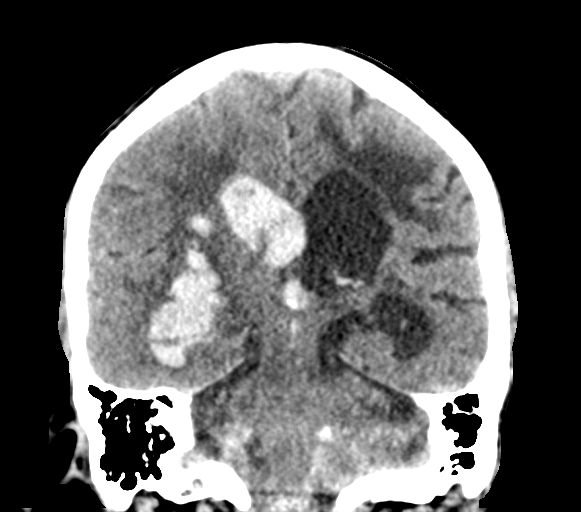

[Series 6: head without sag · sagittal · non-contrast · 0.31mm/px · 3 of 63 slices shown]
[im 21/63  brain]
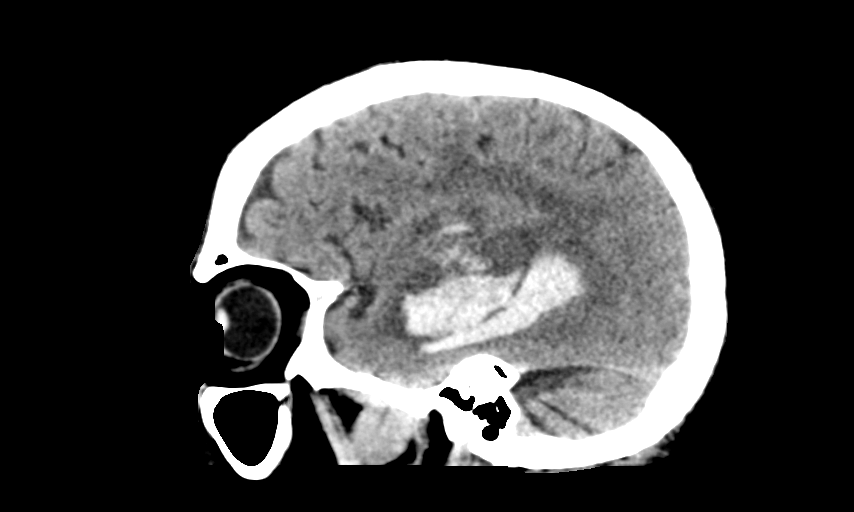
[im 32/63  brain]
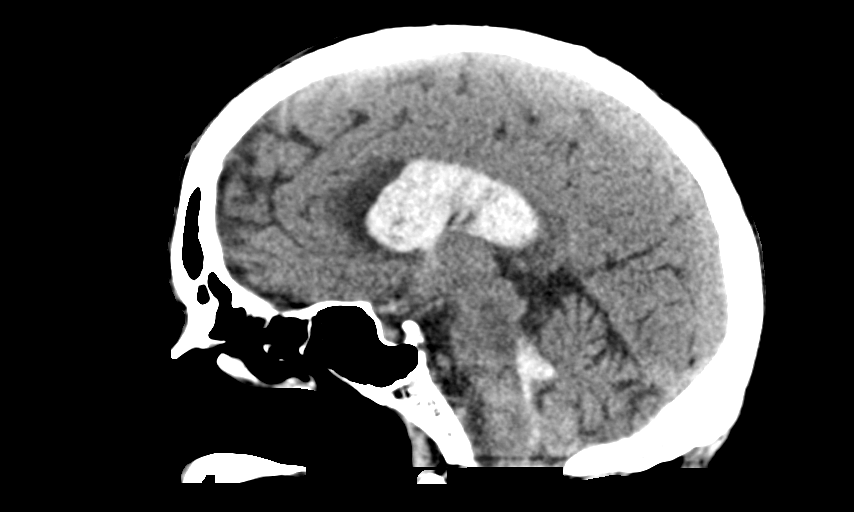
[im 42/63  brain]
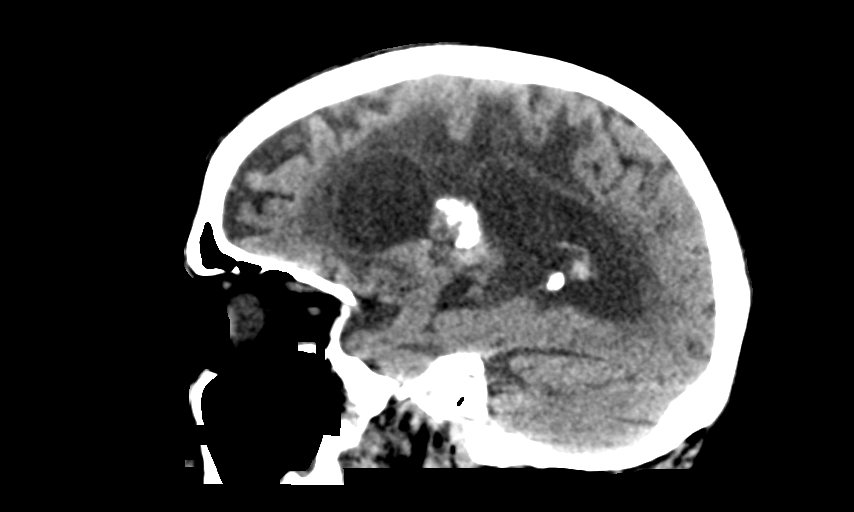

[15 of 47 positions shown; findings below may reference images not displayed]

FINDINGS: Brain: Large volume of intraventricular hemorrhage seen throughout
the right lateral ventricle and extending to the left bilateral
foramina of L[place]ch[place]. Parenchymal component is seen at the posterior
right putamen with hemorrhagic area measuring up to 17 mm in extent.

Ventriculomegaly that is likely a combination of ventricular
obstruction and atrophy. Dense encephalomalacia and volume loss with
dystrophic calcification on the left where there was basal ganglia
hemorrhage seen in 9775. No acute cortical infarct is seen.

Vascular: No hyperdense vessel or unexpected calcification.

Skull: Normal. Negative for fracture or focal lesion.

Sinuses/Orbits: No acute finding.

Critical Value/emergent results were called by telephone at the time
of interpretation on 10/04/2019 at [DATE] to provider RIFOZEMKA MENTOVIC ,
who is already aware.
IMPRESSION: 1. Large volume of intraventricular hemorrhage likely originating
from the right basal ganglia. There is ventriculomegaly that is
likely from both ventricular obstruction and atrophy.
2. Chronic small vessel disease and sequela of remote left basal
ganglia hemorrhage seen in 9775.
